# Patient Record
Sex: Female | Born: 1965 | Race: White | Hispanic: No | Marital: Single | Smoking: Current every day smoker
Health system: Southern US, Community
[De-identification: ages and names within clinical notes are randomized; demographics above are authoritative.]

## PROBLEM LIST (undated history)

## (undated) DIAGNOSIS — I10 Essential (primary) hypertension: Secondary | ICD-10-CM

## (undated) DIAGNOSIS — F319 Bipolar disorder, unspecified: Secondary | ICD-10-CM

## (undated) DIAGNOSIS — IMO0002 Reserved for concepts with insufficient information to code with codable children: Secondary | ICD-10-CM

## (undated) DIAGNOSIS — J449 Chronic obstructive pulmonary disease, unspecified: Secondary | ICD-10-CM

## (undated) DIAGNOSIS — Z22322 Carrier or suspected carrier of Methicillin resistant Staphylococcus aureus: Secondary | ICD-10-CM

## (undated) DIAGNOSIS — K219 Gastro-esophageal reflux disease without esophagitis: Secondary | ICD-10-CM

## (undated) DIAGNOSIS — C539 Malignant neoplasm of cervix uteri, unspecified: Secondary | ICD-10-CM

## (undated) DIAGNOSIS — M1990 Unspecified osteoarthritis, unspecified site: Secondary | ICD-10-CM

## (undated) DIAGNOSIS — J189 Pneumonia, unspecified organism: Secondary | ICD-10-CM

## (undated) DIAGNOSIS — R569 Unspecified convulsions: Secondary | ICD-10-CM

## (undated) DIAGNOSIS — C801 Malignant (primary) neoplasm, unspecified: Secondary | ICD-10-CM

## (undated) DIAGNOSIS — B182 Chronic viral hepatitis C: Secondary | ICD-10-CM

## (undated) DIAGNOSIS — D6851 Activated protein C resistance: Secondary | ICD-10-CM

## (undated) HISTORY — DX: Unspecified osteoarthritis, unspecified site: M19.90

## (undated) HISTORY — PX: ABDOMINAL HYSTERECTOMY: SHX81

## (undated) HISTORY — PX: COLONOSCOPY: SHX174

## (undated) HISTORY — PX: OTHER SURGICAL HISTORY: SHX169

## (undated) HISTORY — DX: Bipolar disorder, unspecified: F31.9

## (undated) HISTORY — PX: BACK SURGERY: SHX140

## (undated) HISTORY — DX: Malignant neoplasm of cervix uteri, unspecified: C53.9

## (undated) HISTORY — PX: MASS EXCISION: SHX2000

## (undated) HISTORY — DX: Unspecified convulsions: R56.9

---

## 1998-06-22 ENCOUNTER — Inpatient Hospital Stay (HOSPITAL_COMMUNITY): Admission: EM | Admit: 1998-06-22 | Discharge: 1998-06-29 | Payer: Self-pay | Admitting: *Deleted

## 1998-06-27 ENCOUNTER — Encounter: Payer: Self-pay | Admitting: Psychiatry

## 1998-07-12 ENCOUNTER — Emergency Department (HOSPITAL_COMMUNITY): Admission: EM | Admit: 1998-07-12 | Discharge: 1998-07-12 | Payer: Self-pay | Admitting: Emergency Medicine

## 1998-09-25 ENCOUNTER — Emergency Department (HOSPITAL_COMMUNITY): Admission: EM | Admit: 1998-09-25 | Discharge: 1998-09-25 | Payer: Self-pay | Admitting: Emergency Medicine

## 1998-12-03 ENCOUNTER — Inpatient Hospital Stay (HOSPITAL_COMMUNITY): Admission: AD | Admit: 1998-12-03 | Discharge: 1998-12-12 | Payer: Self-pay | Admitting: *Deleted

## 1998-12-12 ENCOUNTER — Inpatient Hospital Stay (HOSPITAL_COMMUNITY): Admission: EM | Admit: 1998-12-12 | Discharge: 1998-12-13 | Payer: Self-pay | Admitting: *Deleted

## 2001-01-29 ENCOUNTER — Emergency Department (HOSPITAL_COMMUNITY): Admission: EM | Admit: 2001-01-29 | Discharge: 2001-01-30 | Payer: Self-pay | Admitting: *Deleted

## 2001-04-19 ENCOUNTER — Emergency Department (HOSPITAL_COMMUNITY): Admission: EM | Admit: 2001-04-19 | Discharge: 2001-04-20 | Payer: Self-pay | Admitting: *Deleted

## 2001-04-19 ENCOUNTER — Encounter: Payer: Self-pay | Admitting: *Deleted

## 2001-06-07 ENCOUNTER — Other Ambulatory Visit: Admission: RE | Admit: 2001-06-07 | Discharge: 2001-06-07 | Payer: Self-pay | Admitting: Dermatology

## 2001-08-30 ENCOUNTER — Emergency Department (HOSPITAL_COMMUNITY): Admission: EM | Admit: 2001-08-30 | Discharge: 2001-08-30 | Payer: Self-pay | Admitting: *Deleted

## 2001-09-15 ENCOUNTER — Emergency Department (HOSPITAL_COMMUNITY): Admission: EM | Admit: 2001-09-15 | Discharge: 2001-09-15 | Payer: Self-pay | Admitting: Emergency Medicine

## 2001-09-15 ENCOUNTER — Encounter: Payer: Self-pay | Admitting: Emergency Medicine

## 2001-09-27 ENCOUNTER — Encounter: Payer: Self-pay | Admitting: *Deleted

## 2001-09-27 ENCOUNTER — Emergency Department (HOSPITAL_COMMUNITY): Admission: EM | Admit: 2001-09-27 | Discharge: 2001-09-27 | Payer: Self-pay | Admitting: *Deleted

## 2001-11-27 ENCOUNTER — Encounter: Payer: Self-pay | Admitting: Emergency Medicine

## 2001-11-27 ENCOUNTER — Inpatient Hospital Stay (HOSPITAL_COMMUNITY): Admission: EM | Admit: 2001-11-27 | Discharge: 2001-11-27 | Payer: Self-pay | Admitting: Emergency Medicine

## 2003-04-12 ENCOUNTER — Inpatient Hospital Stay (HOSPITAL_COMMUNITY): Admission: EM | Admit: 2003-04-12 | Discharge: 2003-04-13 | Payer: Self-pay | Admitting: Emergency Medicine

## 2003-04-13 ENCOUNTER — Inpatient Hospital Stay (HOSPITAL_COMMUNITY): Admission: AD | Admit: 2003-04-13 | Discharge: 2003-04-20 | Payer: Self-pay | Admitting: Psychiatry

## 2003-04-25 ENCOUNTER — Emergency Department (HOSPITAL_COMMUNITY): Admission: EM | Admit: 2003-04-25 | Discharge: 2003-04-25 | Payer: Self-pay | Admitting: Emergency Medicine

## 2003-05-02 ENCOUNTER — Emergency Department (HOSPITAL_COMMUNITY): Admission: EM | Admit: 2003-05-02 | Discharge: 2003-05-02 | Payer: Self-pay | Admitting: Emergency Medicine

## 2003-05-02 ENCOUNTER — Ambulatory Visit (HOSPITAL_COMMUNITY): Admission: RE | Admit: 2003-05-02 | Discharge: 2003-05-02 | Payer: Self-pay | Admitting: Unknown Physician Specialty

## 2003-09-27 ENCOUNTER — Emergency Department (HOSPITAL_COMMUNITY): Admission: EM | Admit: 2003-09-27 | Discharge: 2003-09-28 | Payer: Self-pay | Admitting: *Deleted

## 2003-11-15 ENCOUNTER — Ambulatory Visit: Payer: Self-pay | Admitting: Orthopedic Surgery

## 2003-11-28 ENCOUNTER — Ambulatory Visit (HOSPITAL_COMMUNITY): Admission: RE | Admit: 2003-11-28 | Discharge: 2003-11-28 | Payer: Self-pay | Admitting: *Deleted

## 2004-01-02 ENCOUNTER — Emergency Department (HOSPITAL_COMMUNITY): Admission: EM | Admit: 2004-01-02 | Discharge: 2004-01-02 | Payer: Self-pay | Admitting: Emergency Medicine

## 2004-11-21 ENCOUNTER — Emergency Department (HOSPITAL_COMMUNITY): Admission: EM | Admit: 2004-11-21 | Discharge: 2004-11-21 | Payer: Self-pay | Admitting: Emergency Medicine

## 2004-11-24 ENCOUNTER — Emergency Department (HOSPITAL_COMMUNITY): Admission: EM | Admit: 2004-11-24 | Discharge: 2004-11-24 | Payer: Self-pay | Admitting: Emergency Medicine

## 2005-11-12 ENCOUNTER — Ambulatory Visit (HOSPITAL_BASED_OUTPATIENT_CLINIC_OR_DEPARTMENT_OTHER): Admission: RE | Admit: 2005-11-12 | Discharge: 2005-11-12 | Payer: Self-pay | Admitting: Orthopedic Surgery

## 2005-12-16 ENCOUNTER — Emergency Department (HOSPITAL_COMMUNITY): Admission: EM | Admit: 2005-12-16 | Discharge: 2005-12-16 | Payer: Self-pay | Admitting: Emergency Medicine

## 2005-12-18 ENCOUNTER — Ambulatory Visit: Payer: Self-pay | Admitting: Orthopedic Surgery

## 2006-09-04 ENCOUNTER — Emergency Department (HOSPITAL_COMMUNITY): Admission: EM | Admit: 2006-09-04 | Discharge: 2006-09-04 | Payer: Self-pay | Admitting: Emergency Medicine

## 2006-09-10 ENCOUNTER — Ambulatory Visit: Payer: Self-pay | Admitting: Orthopedic Surgery

## 2006-09-19 ENCOUNTER — Emergency Department (HOSPITAL_COMMUNITY): Admission: EM | Admit: 2006-09-19 | Discharge: 2006-09-19 | Payer: Self-pay | Admitting: Emergency Medicine

## 2006-10-14 ENCOUNTER — Emergency Department (HOSPITAL_COMMUNITY): Admission: EM | Admit: 2006-10-14 | Discharge: 2006-10-14 | Payer: Self-pay | Admitting: Emergency Medicine

## 2006-12-04 ENCOUNTER — Emergency Department (HOSPITAL_COMMUNITY): Admission: EM | Admit: 2006-12-04 | Discharge: 2006-12-04 | Payer: Self-pay | Admitting: Emergency Medicine

## 2007-01-24 ENCOUNTER — Emergency Department (HOSPITAL_COMMUNITY): Admission: EM | Admit: 2007-01-24 | Discharge: 2007-01-24 | Payer: Self-pay | Admitting: Emergency Medicine

## 2007-02-10 ENCOUNTER — Ambulatory Visit (HOSPITAL_COMMUNITY): Admission: RE | Admit: 2007-02-10 | Discharge: 2007-02-10 | Payer: Self-pay | Admitting: *Deleted

## 2007-03-08 ENCOUNTER — Emergency Department (HOSPITAL_COMMUNITY): Admission: EM | Admit: 2007-03-08 | Discharge: 2007-03-08 | Payer: Self-pay | Admitting: Emergency Medicine

## 2007-04-06 ENCOUNTER — Emergency Department (HOSPITAL_COMMUNITY): Admission: EM | Admit: 2007-04-06 | Discharge: 2007-04-06 | Payer: Self-pay | Admitting: Emergency Medicine

## 2007-08-15 ENCOUNTER — Emergency Department (HOSPITAL_COMMUNITY): Admission: EM | Admit: 2007-08-15 | Discharge: 2007-08-15 | Payer: Self-pay | Admitting: Emergency Medicine

## 2007-11-02 ENCOUNTER — Encounter (HOSPITAL_COMMUNITY): Admission: RE | Admit: 2007-11-02 | Discharge: 2007-12-02 | Payer: Self-pay | Admitting: *Deleted

## 2007-11-13 ENCOUNTER — Emergency Department (HOSPITAL_COMMUNITY): Admission: EM | Admit: 2007-11-13 | Discharge: 2007-11-13 | Payer: Self-pay | Admitting: Emergency Medicine

## 2007-11-16 ENCOUNTER — Ambulatory Visit (HOSPITAL_COMMUNITY): Admission: RE | Admit: 2007-11-16 | Discharge: 2007-11-16 | Payer: Self-pay | Admitting: Neurological Surgery

## 2007-11-29 ENCOUNTER — Ambulatory Visit (HOSPITAL_COMMUNITY): Admission: RE | Admit: 2007-11-29 | Discharge: 2007-11-30 | Payer: Self-pay | Admitting: Neurological Surgery

## 2007-12-17 ENCOUNTER — Encounter: Payer: Self-pay | Admitting: Neurosurgery

## 2007-12-18 ENCOUNTER — Inpatient Hospital Stay (HOSPITAL_COMMUNITY): Admission: EM | Admit: 2007-12-18 | Discharge: 2007-12-30 | Payer: Self-pay | Admitting: Neurological Surgery

## 2007-12-18 ENCOUNTER — Ambulatory Visit: Payer: Self-pay | Admitting: Pulmonary Disease

## 2007-12-18 ENCOUNTER — Ambulatory Visit: Payer: Self-pay | Admitting: Internal Medicine

## 2007-12-20 ENCOUNTER — Encounter: Payer: Self-pay | Admitting: Emergency Medicine

## 2007-12-23 ENCOUNTER — Ambulatory Visit: Payer: Self-pay | Admitting: Infectious Diseases

## 2007-12-25 ENCOUNTER — Encounter (INDEPENDENT_AMBULATORY_CARE_PROVIDER_SITE_OTHER): Payer: Self-pay | Admitting: Interventional Radiology

## 2007-12-27 ENCOUNTER — Encounter (INDEPENDENT_AMBULATORY_CARE_PROVIDER_SITE_OTHER): Payer: Self-pay | Admitting: Cardiology

## 2008-01-18 ENCOUNTER — Encounter: Admission: RE | Admit: 2008-01-18 | Discharge: 2008-01-18 | Payer: Self-pay | Admitting: Neurological Surgery

## 2008-02-28 ENCOUNTER — Ambulatory Visit (HOSPITAL_COMMUNITY): Admission: RE | Admit: 2008-02-28 | Discharge: 2008-02-28 | Payer: Self-pay | Admitting: *Deleted

## 2008-05-12 ENCOUNTER — Ambulatory Visit (HOSPITAL_COMMUNITY): Admission: RE | Admit: 2008-05-12 | Discharge: 2008-05-12 | Payer: Self-pay | Admitting: *Deleted

## 2008-06-26 ENCOUNTER — Ambulatory Visit (HOSPITAL_COMMUNITY): Admission: RE | Admit: 2008-06-26 | Discharge: 2008-06-26 | Payer: Self-pay | Admitting: *Deleted

## 2009-01-31 IMAGING — CR DG CHEST 1V PORT
1 series · 1 of 1 positions shown · non-contrast
Comparison: Chest x-ray of 12/19/2007

CLINICAL DATA: Pneumonia, wound infection

PORTABLE CHEST - 1 VIEW

[AP]
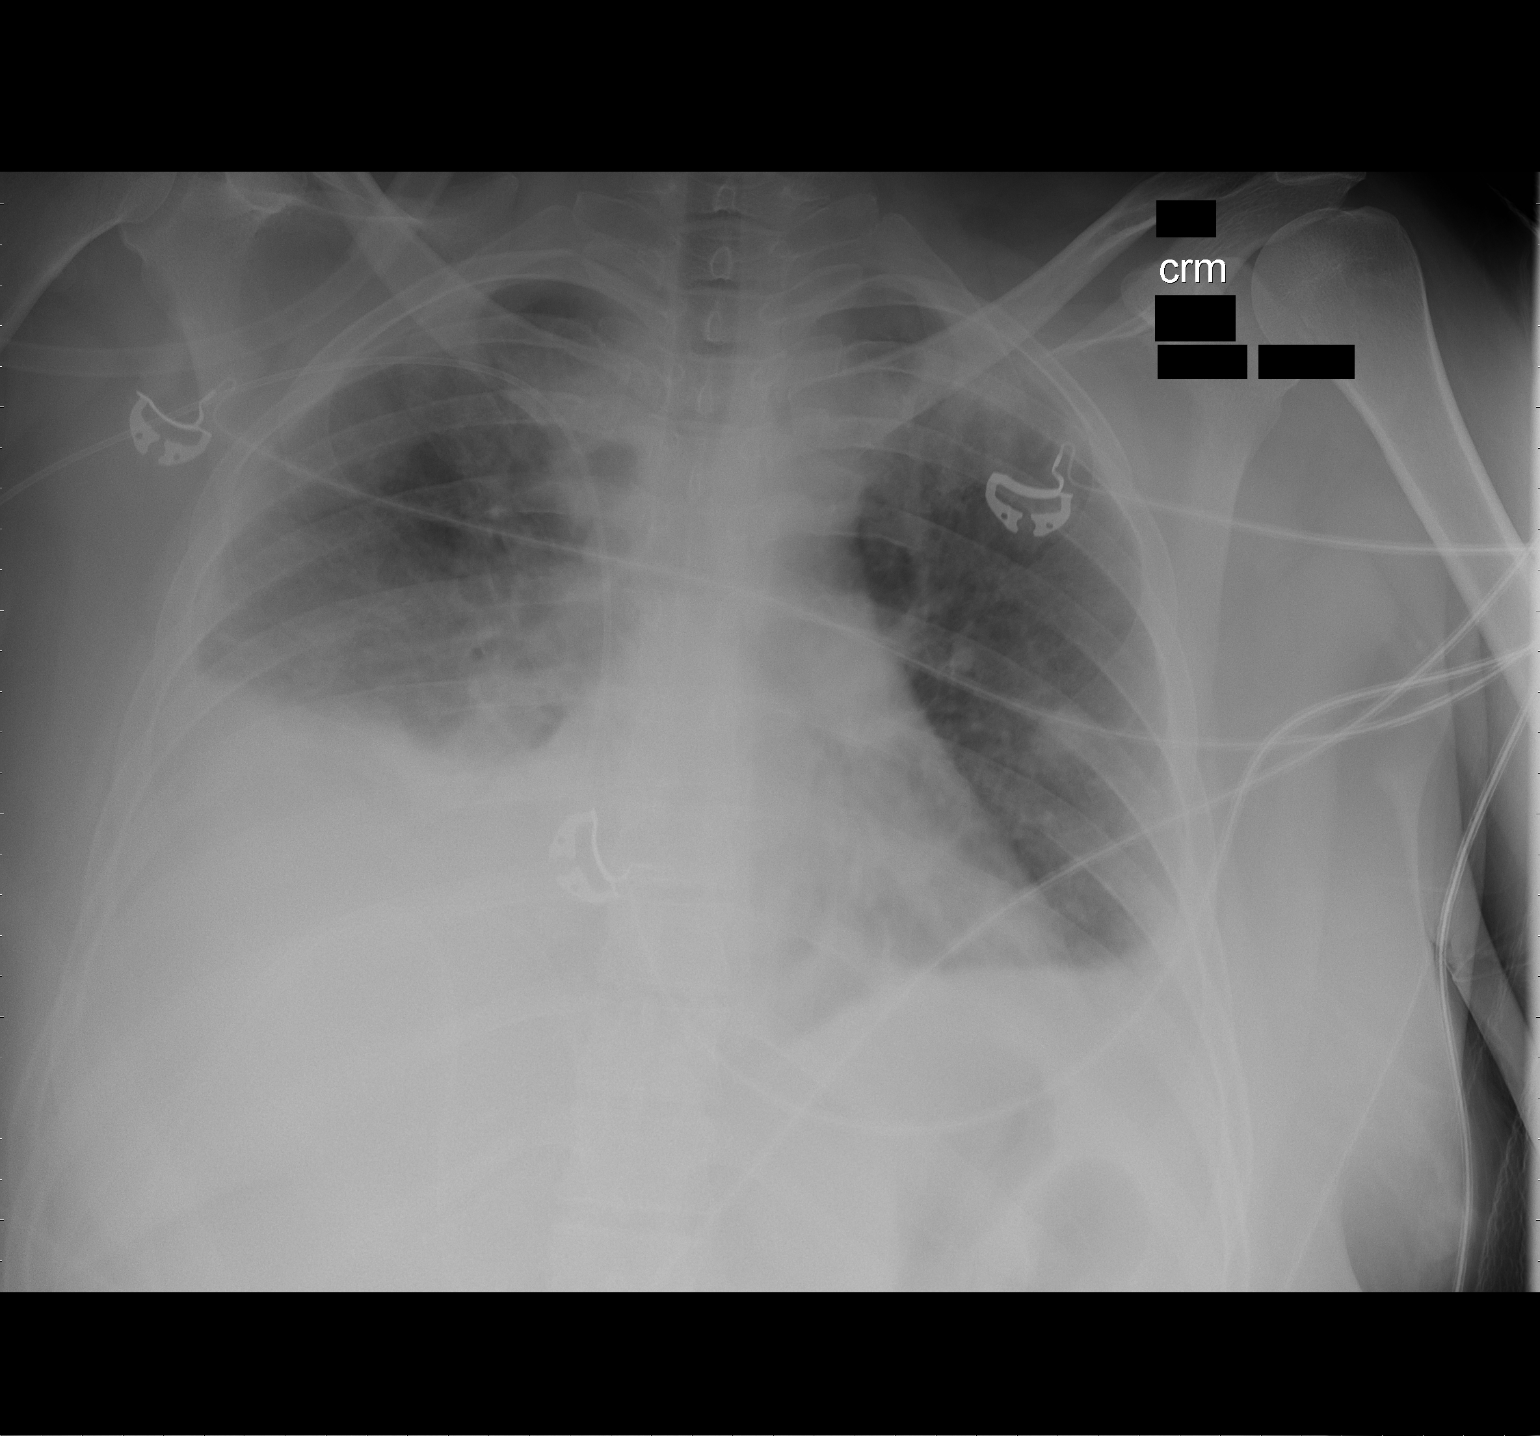

[1 of 1 positions shown; findings below may reference images not displayed]

FINDINGS: The lungs are poorly aerated with basilar atelectasis and
effusions right greater than left.  The cavitary pulmonary nodules
noted on CT are not well visualized on chest x-ray.  Right PICC
line remains with the tip in the lower SVC just above the expected
right atrial junction.  Heart size is stable.
IMPRESSION: No significant change in poor aeration with effusions.  A nodular
lesion is noted in the left mid lung base with the remainder of the
cavitary lesions noted on CT not being well seen by chest x-ray.

## 2009-02-01 IMAGING — CR DG CHEST 1V PORT
1 series · 1 of 1 positions shown · non-contrast
Comparison: 12/20/2007

CLINICAL DATA: Wound infection.  Evaluate respiratory status. Prior
chest CT demonstrated cavitary lesions.

PORTABLE CHEST - 1 VIEW

[AP]
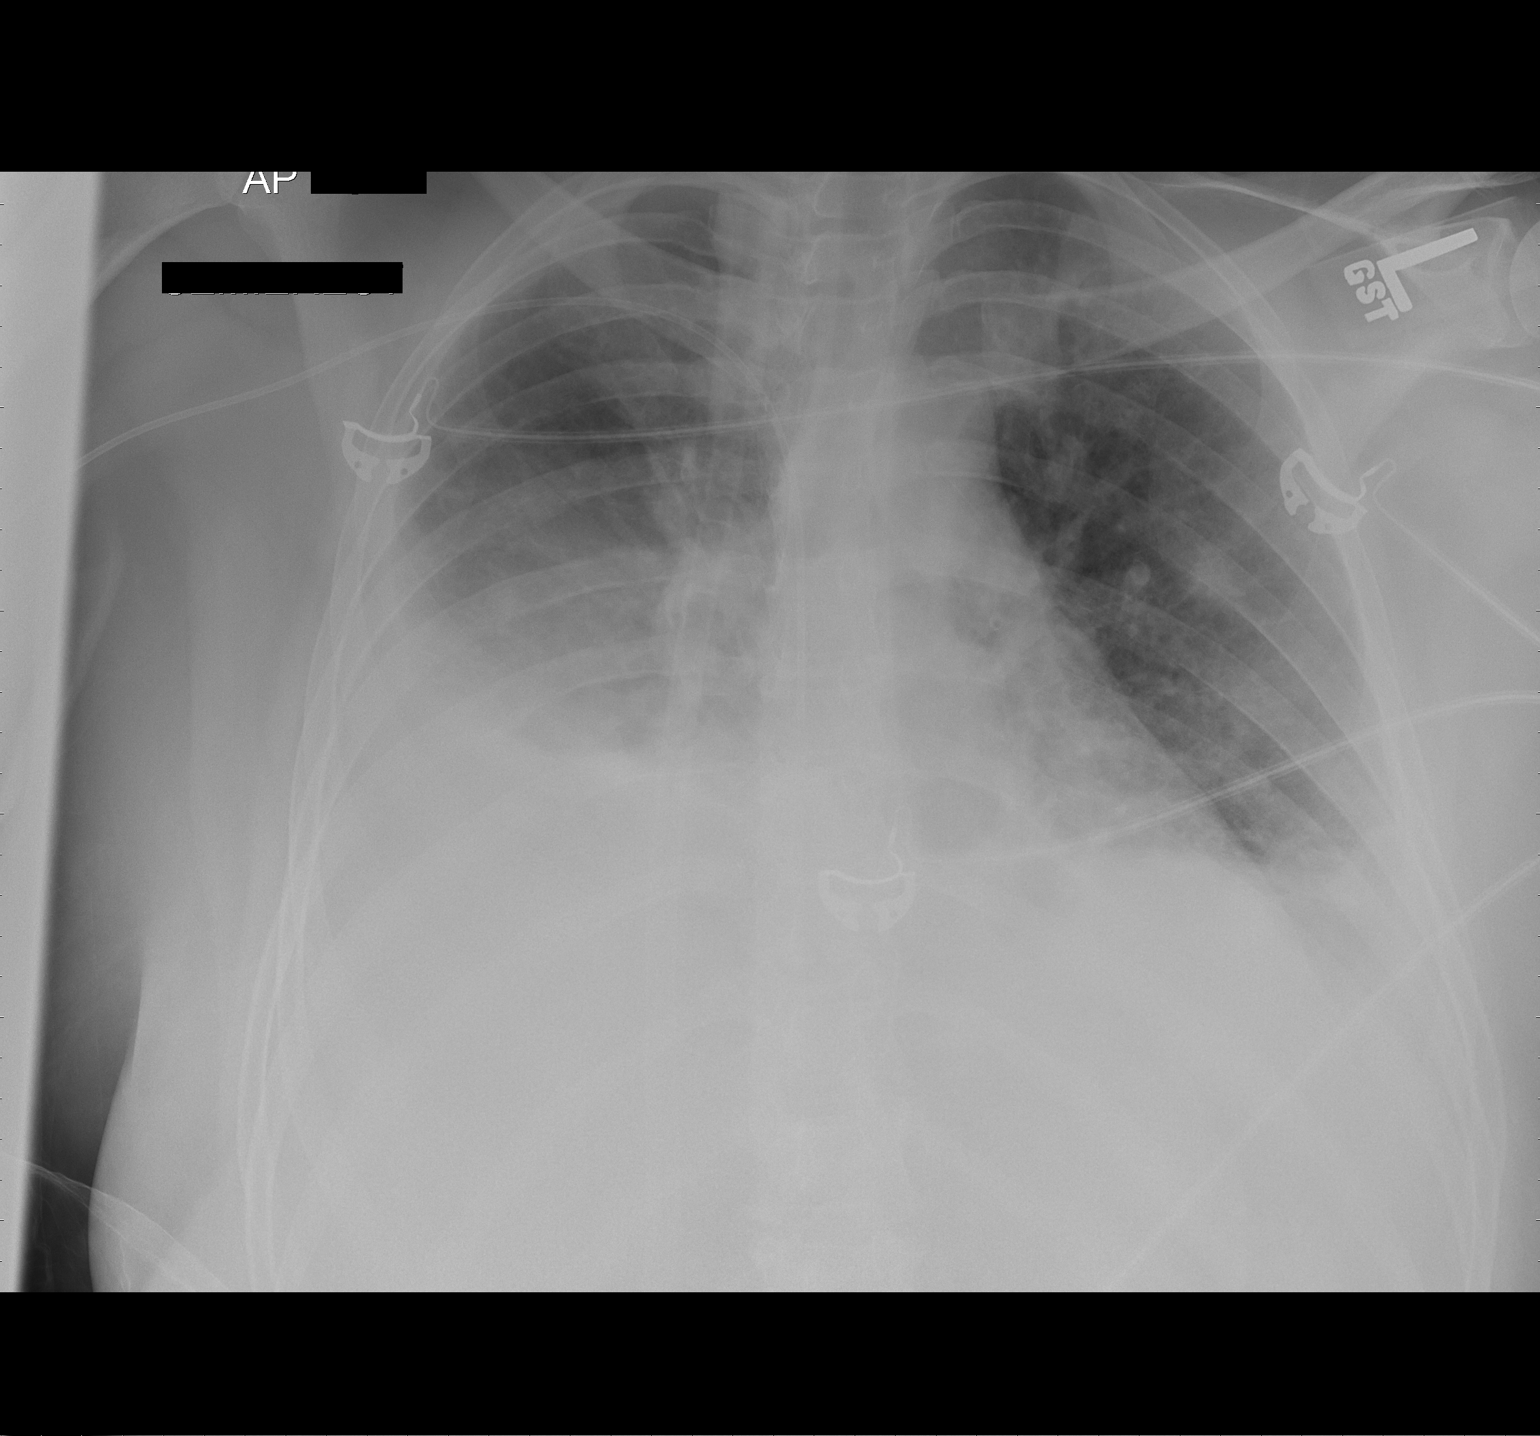

[1 of 1 positions shown; findings below may reference images not displayed]

FINDINGS: Right upper extremity PICC is present with the tip in the
lower SVC, unchanged from prior examination.  Moderate bilateral
pleural effusions, right greater than left.  Positional shift in
the right pleural effusion with more posterior/dependent layering.

Nodular lung lesions again visualized in the left and right
midlung.  Cardiopericardial silhouette grossly unchanged.

Allowing for positional differences, no interval change from
yesterday.
IMPRESSION: 1.  Stable right upper extremity PICC.
2.  Slight positional shift and bilateral pleural effusions.
Otherwise no interval change.
3.  Limited visualization of bilateral lung nodular lesions.

## 2009-02-04 ENCOUNTER — Emergency Department (HOSPITAL_COMMUNITY): Admission: EM | Admit: 2009-02-04 | Discharge: 2009-02-04 | Payer: Self-pay | Admitting: Emergency Medicine

## 2009-02-04 IMAGING — US US CHEST/MEDIASTINUM
1 series · 5 of 5 positions shown · non-contrast
Comparison: Prior thoracentesis imaging on 12/22/2007 and CT of the
chest on 12/19/2007.

CLINICAL DATA: Pneumonia, bacteremia and right pleural effusion.
Chest x-ray has demonstrated probable increasing right pleural
effusion and request has been made to perform thoracentesis versus
percutaneous drainage catheter placement.

CHEST ULTRASOUND

[Series 1: unknown · 0.33mm/px · 5 of 5 slices shown]
[im 1/5]
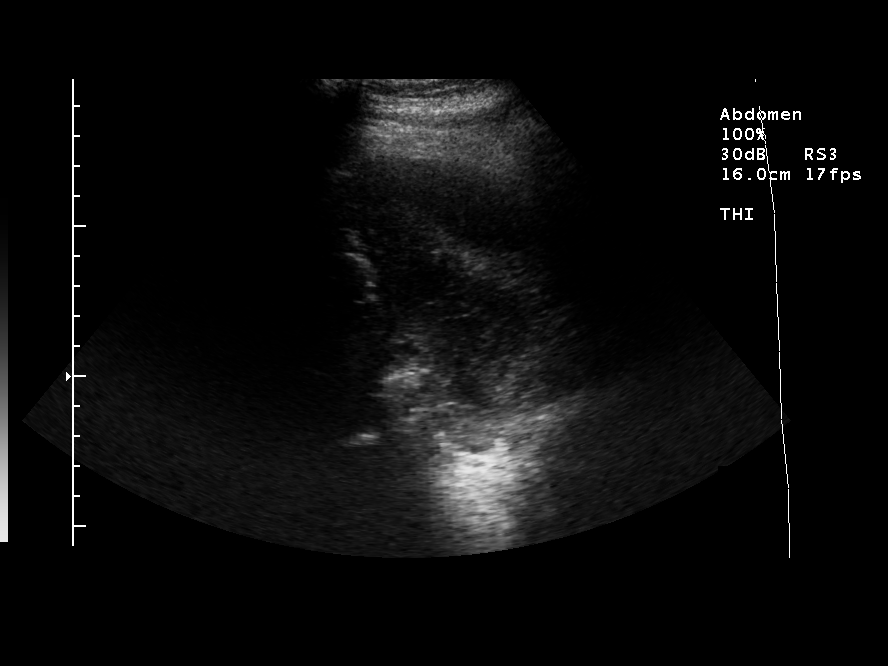
[im 2/5]
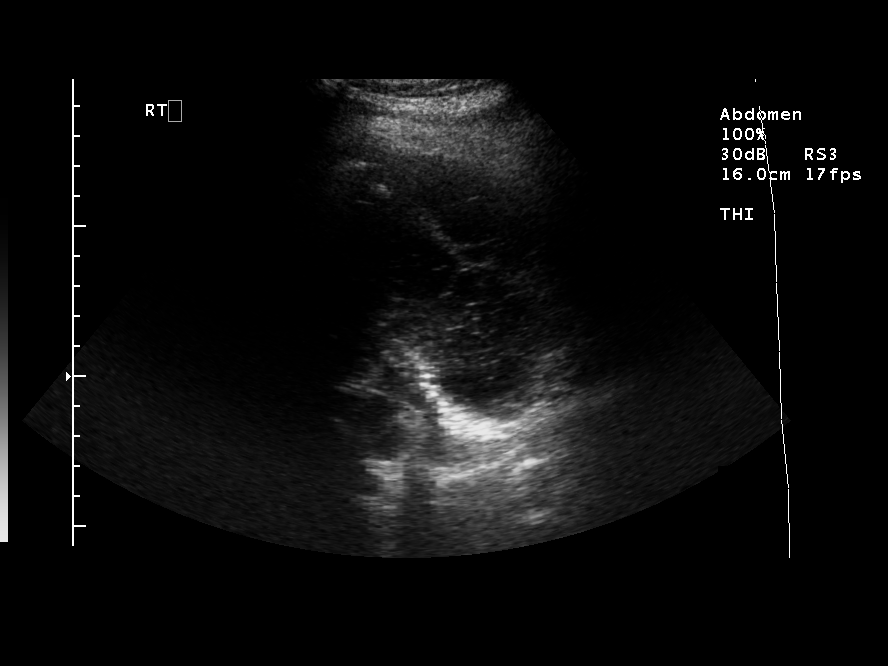
[im 3/5]
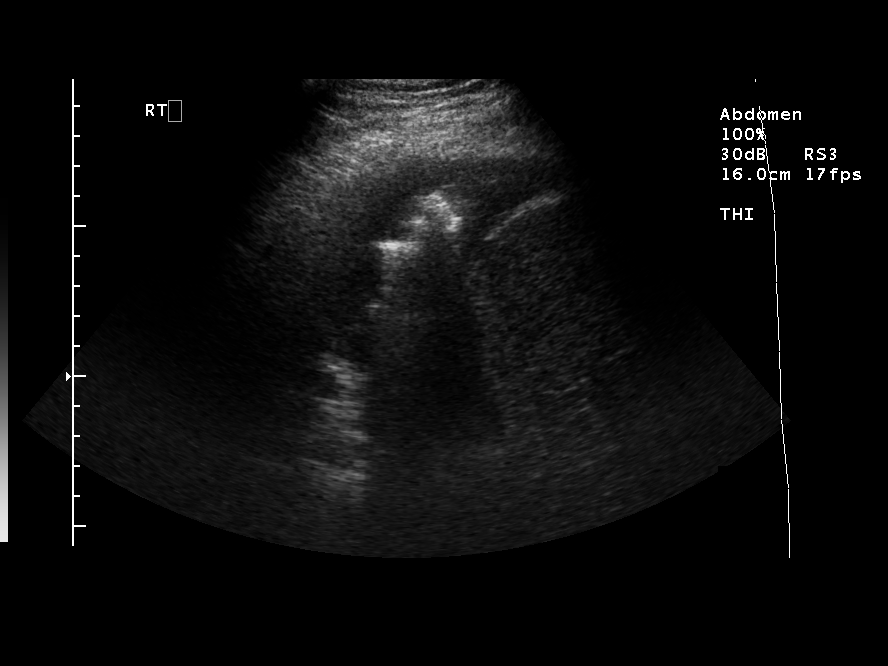
[im 4/5]
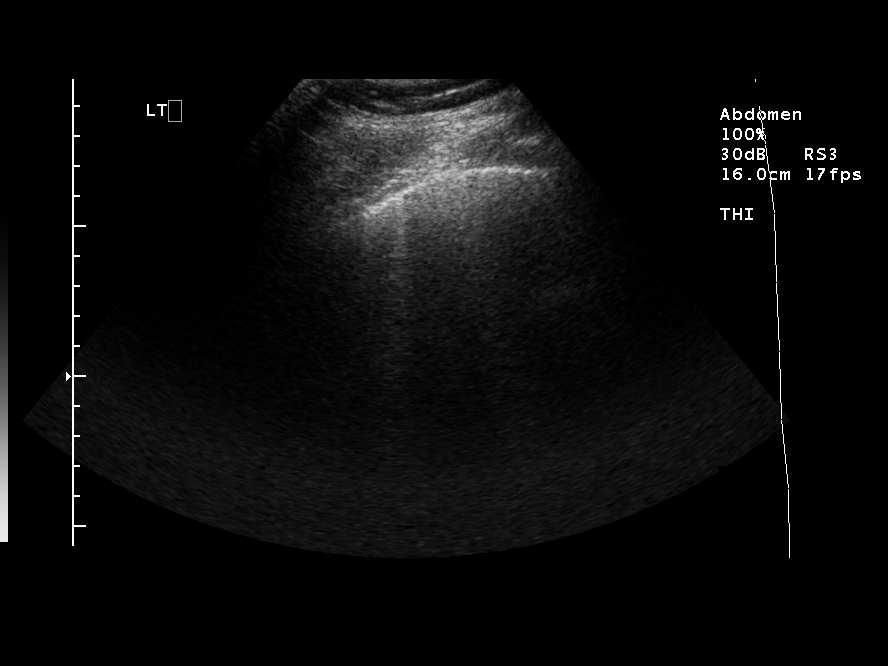
[im 5/5]
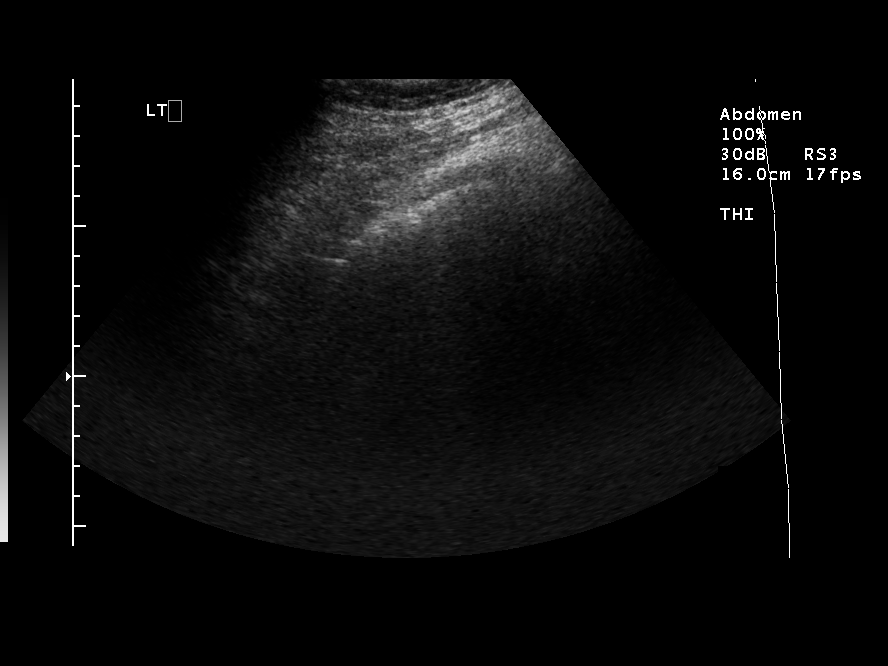

[5 of 5 positions shown; findings below may reference images not displayed]

FINDINGS: Prior to a possible ultrasound guided procedure,
ultrasound was performed of the right posterior chest with the the
patient in a sitting upright position.  This demonstrates a
component of a loculated and echogenic pleural collection as well
as densely consolidated underlying lung.  It was not totally clear
how much of the echogenic abnormality was in the pleural space and
how much was actually in the lung parenchyma.  I was not
comfortable today with an ultrasound guided procedure.

CT of the chest is recommended to reevaluate the process in the
right lung.  This will be performed on 12/25/2007.  At that time,
if appropriate, either CT guided thoracentesis or CT guided tube
thoracostomy will be performed.  This plan was discussed directly
with the patient.
IMPRESSION: Ultrasound demonstrates component of a loculated and very echogenic
pleural effusion as well as densely consolidated underlying lung.
It was unclear how much of the abnormality was actually in the
pleural space and therefore an ultrasound guided procedure was not
performed.  Plans have been made to perform a CT of the chest in
the morning with possible CT guided thoracentesis or tube
thoracostomy at that time.

## 2009-02-14 ENCOUNTER — Emergency Department (HOSPITAL_COMMUNITY): Admission: EM | Admit: 2009-02-14 | Discharge: 2009-02-14 | Payer: Self-pay | Admitting: Emergency Medicine

## 2009-09-01 ENCOUNTER — Emergency Department (HOSPITAL_COMMUNITY): Admission: EM | Admit: 2009-09-01 | Discharge: 2009-09-02 | Payer: Self-pay | Admitting: Emergency Medicine

## 2010-01-27 ENCOUNTER — Encounter: Payer: Self-pay | Admitting: *Deleted

## 2010-03-24 ENCOUNTER — Emergency Department (HOSPITAL_COMMUNITY)
Admission: EM | Admit: 2010-03-24 | Discharge: 2010-03-24 | Disposition: A | Discharge status: Home / Self Care | Payer: Medicaid Other | Attending: Emergency Medicine | Admitting: Emergency Medicine

## 2010-03-24 ENCOUNTER — Emergency Department (HOSPITAL_COMMUNITY): Payer: Medicaid Other

## 2010-03-24 DIAGNOSIS — M549 Dorsalgia, unspecified: Secondary | ICD-10-CM | POA: Insufficient documentation

## 2010-03-24 DIAGNOSIS — G8929 Other chronic pain: Secondary | ICD-10-CM | POA: Insufficient documentation

## 2010-03-24 DIAGNOSIS — J209 Acute bronchitis, unspecified: Secondary | ICD-10-CM | POA: Insufficient documentation

## 2010-03-24 DIAGNOSIS — Z79899 Other long term (current) drug therapy: Secondary | ICD-10-CM | POA: Insufficient documentation

## 2010-04-22 ENCOUNTER — Emergency Department (HOSPITAL_COMMUNITY)
Admission: EM | Admit: 2010-04-22 | Discharge: 2010-04-22 | Disposition: A | Discharge status: Home / Self Care | Payer: Medicaid Other | Attending: Emergency Medicine | Admitting: Emergency Medicine

## 2010-04-22 ENCOUNTER — Emergency Department (HOSPITAL_COMMUNITY): Payer: Medicaid Other

## 2010-04-22 DIAGNOSIS — M79609 Pain in unspecified limb: Secondary | ICD-10-CM | POA: Insufficient documentation

## 2010-04-22 DIAGNOSIS — Z79899 Other long term (current) drug therapy: Secondary | ICD-10-CM | POA: Insufficient documentation

## 2010-04-22 DIAGNOSIS — F17200 Nicotine dependence, unspecified, uncomplicated: Secondary | ICD-10-CM | POA: Insufficient documentation

## 2010-04-22 DIAGNOSIS — J449 Chronic obstructive pulmonary disease, unspecified: Secondary | ICD-10-CM | POA: Insufficient documentation

## 2010-04-22 DIAGNOSIS — M25559 Pain in unspecified hip: Secondary | ICD-10-CM | POA: Insufficient documentation

## 2010-04-22 DIAGNOSIS — J4489 Other specified chronic obstructive pulmonary disease: Secondary | ICD-10-CM | POA: Insufficient documentation

## 2010-04-22 DIAGNOSIS — W64XXXA Exposure to other animate mechanical forces, initial encounter: Secondary | ICD-10-CM | POA: Insufficient documentation

## 2010-04-22 DIAGNOSIS — T07XXXA Unspecified multiple injuries, initial encounter: Secondary | ICD-10-CM | POA: Insufficient documentation

## 2010-04-22 DIAGNOSIS — B1920 Unspecified viral hepatitis C without hepatic coma: Secondary | ICD-10-CM | POA: Insufficient documentation

## 2010-05-21 NOTE — Discharge Summary (Signed)
[name], [name]             ACCOUNT NO.:  [id number]   MEDICAL RECORD NO.:  [id number]          PATIENT TYPE:  INP   LOCATION:  3027                         FACILITY:  [facility]   PHYSICIAN:  Charlestine Massed, MDDATE OF BIRTH:  10/28/1965   DATE OF ADMISSION:  12/18/2007  DATE OF DISCHARGE:                               DISCHARGE SUMMARY   DATE OF DISCHARGE:  Not known.   REASON FOR ADMISSION:  Lumbar wound infection.   HOSPITAL COURSE:  [name], who is a 45 year old female, who is status  3 weeks post lumbar microdiskectomy, presented with fever, back pain,  and drainage from the wound, and the patient was seen by Neurosurgery,  Dr. Marikay Alar.  Dr. Marikay Alar saw the patient and they evaluated for  a lumbar wound postoperative infection.  The patient had a surgery done,  and the wound was debrided and the internal tissues were sent for  culture, which grew MRSA, and so patient was started on IV vancomycin by  Neurosurgery, and patient was transferred to our service.  Blood  cultures did not grow any organisms so far.  Patient was observed on the  floor with IV antibiotics.  An ID consult was called and they evaluated  the patient and a TEE was advised by ID to rule out any coexistent  endocarditis, and also ID wanted 6 weeks of total IV vancomycin therapy  for further treatment of the lumbar wound infection.  In the meantime,  patient had a rise in the WBC count and a right-sided loculated pleural  effusion was seen on CAT scan so a CT-guided drainage was done and a  chest tube was placed.  The pleural fluid examination revealed there is  an exudate, but the gram stain revealed there is no evidence of any  infection so a diagnosis of parapneumonic effusion was made.  The  patient is still continuing on the chest tube and there is mild amber-  colored fluid draining out of the area.  The patient is currently  afebrile.  The WBC count is coming down.  The patient is  planned for  PICC line placement tomorrow in the a.m. for a total of 6 weeks of IV  vancomycin therapy.   CURRENT ALLERGIES:  The patient is ALLERGIC TO PENICILLIN.   CURRENT MEDICATIONS:  Vancomycin as per aggressive protocol, NovoLog  coverage for blood sugars, Protonix,  Albuterol MDI, oxycodone for pain,  Gabapentin, and Cymbalta.   DISPOSITION:  The patient will be discharged once her PICC line is  placed and the chest tube is removed.  The patient will be observed for  the next 24 hours after chest tube removal and will be discharged home  with IV vancomycin.  Social Services is already involved in placing a  visiting nurse for her IV vancomycin at home.  The patient to follow up  with Dr. Marikay Alar, Neurosurgery, for further management as well as  with her primary care doctor for further primary care services.      Charlestine Massed, MD  Electronically Signed     UT/MEDQ  D:  12/27/2007  T:  12/27/2007  Job:  [id number]

## 2010-05-21 NOTE — Discharge Summary (Signed)
NAMETINSLEE, KLARE             ACCOUNT NO.:  [id number]   MEDICAL RECORD NO.:  [id number]          PATIENT TYPE:  INP   LOCATION:  3027                         FACILITY:  [facility]   PHYSICIAN:  Monte Fantasia, MD  DATE OF BIRTH:  October 29, 1965   DATE OF ADMISSION:  12/18/2007  DATE OF DISCHARGE:  12/30/2007                               DISCHARGE SUMMARY   PRIMARY CARE PHYSICIAN:  Unassigned.   DISCHARGE DIAGNOSES:  1. Status post lumbar wound infection with methicillin-resistant      Staphylococcus aureus infection.  2. Methicillin-resistant Staphylococcus aureus cellulitis.  3. Acute hypoxic respiratory failure secondary to bilateral pneumonia,      which is resolving.  4. Hyponatremia.  5. Adult respiratory distress syndrome, resolved.  6. Lumbar wound infection status post lumbar microdiskectomy.  7. Right-sided loculated parapneumonic effusion.  8. Status post chest tube drainage and removal of the chest tubes.  9. History of hepatitis C.  10.History of lumbar laminectomy.   MEDICATIONS ON DISCHARGE:  1. Colace 100 mg p.o. b.i.d.  2. Cymbalta 120 mg p.o. daily.  3. Advair 250/50 one puff b.i.d.  4. Neurontin 600 mg p.o. q.6 h.  5. Protonix 40 mg p.o. q.12.  6. Vancomycin 1 gm IV q.8 h., day 13 today to complete 42 days that is      6 weeks of IV antibiotics.  7. Albuterol inhalation 2 puffs q.6 h. p.r.n. breathlessness.   HOSPITAL COURSE:  Please refer to the discharge summary dictated on  December 27, 2007, for the hospital course by Dr. Berkley Harvey.  The patient  had a TE, after that which showed no evidence of any bacterial  endocarditis or any other vegetations.  The patient improved very well.  The patient was evaluated by Pulmonary and CT drain chest tube was  removed on 12/29/2007 in the later afternoon.  The patient has been  asymptomatic.  Has had no temperature spikes or respiratory distress  after which the patient is stable to be discharged and can be  discharged  home.  As per the pulmonary recommendation, they recommend to have  follow up CT of the chest after completion of the antibiotics.  The  patient is stable to be discharged home today.   Radiological investigations done during the stay in the hospital   MRI of the lumbar spine done on the December 17, 2007.  Impression,  typical findings along with operative approach of left L3-L4 and L4-L5,  small amount of fluid in the subcutaneous fat, mild edema along  operative approach, very small amount of fluid at the laminectomy site.  No signs of epidural collection though findings are not particularly  worrisome it could not exclude a low-grade infection on the basis of  this appearance.   Chest x-ray done on December 18, 2007.  Impression:  Plate-like lower lobe atelectasis.  If the patient's  symptoms continue, consider PA and lateral radiographs to obtain full  inspiration when the patient is clinically able to.   Chest x-ray done on December 19, 2007.  Impression:  Bilateral airspace disease with nodularity suggest  pneumonia, increased right lower  lobe atelectasis and effusion.   CT of the angio done on December 19, 2007.  Impression:  Bilateral cavitary lung nodules with no consolidative  changes in the right, middle, or lower lobe.  Differential diagnosis  would be fungal pneumonia, Wegener granulomatosis, or septic pulmonary  emboli.  No pulmonary embolism identified on this study.   Chest x-ray done on December 20, 2007.  Impression:  No significant change and poor aeration with effusions,  nodular lesion is noted in the left and middle lung base with remainder  of the cavitary lesions noted on the CT, not well seen by the chest x-  ray.   Chest x-ray done on December 21, 2007.  Impression:  Stable right upper extremity PICC line, slight positional  shift, and bilateral pleural effusions, limited visualization of  bilateral lung nodular lesions.   Chest x-ray  done on December 22, 2007:  Impression:  No pneumothorax, post thoracentesis with significant  residual right pleural effusion.   Chest x-ray done on December 24, 2007.  Impression:  Slightly better aeration, moderate right-sided pleural  effusion with right basilar atelectasis, bilateral lung nodules with  some appearing cavitating.   CT of the chest with contrast done on December 25, 2007.  Impression:  Generally improving parenchymal infection since prior CT  with the decrease in size of the bilateral cavitary lesions and less  prominent confluent airspace disease.  There is, however, increase in  the component of loculated pleural effusion on the right.   CT abscess drainage done on December 25, 2007:  Impression:  Placement of 14-French drainage catheter into the right  pleural space to drain the loculated pleural effusion.  Catheter was  placed in lateral component of loculated pleural effusion.   Chest x-ray done on December 29, 2007.  Impression:  No significant interval change post chest tube removal.  No  evidence of pneumothorax.   PROCEDURES DONE DURING THE STAY IN THE HOSPITAL:  1. Incision and drainage of the lumbar wound done on December 18, 2007.  2. Chest tube, which was placed on December 25, 2007, and removal of      the chest tube done on December 29, 2007.   LABS ON DISCHARGE:  Total WBC 9.2, hemoglobin 9.4, hematocrit 28.2,  platelets 478.  Sodium 138, potassium 4.1, chloride 110, bicarb 24,  glucose 95, BUN 8, creatinine 0.7, calcium 8.2.  Blood cultures done on  December 19, 2007, no growth for 5 days.  Gram stain done on December 18, 2007, rare Gram-positive cocci in clusters and pairs.  Culture of  the sputum done on December 19, 2007, no growth.  Gram stain done on  December 20, 2007, no WBC, negative for bacteria.  Wound culture done on  December 18, 2007, which is moderate methicillin-resistant  Staphylococcus aureus, which was sensitive to  vancomycin, Bactrim,  rifampin, gentamicin, clindamycin, and tetracyclines.  AFB cultures done  on December 13, no acid-fast bacilli seen.  Respiratory cultures done on  December 19, 2007, no growth for 2 days.  Urine culture done on December 20, 2007, no growth.  Anaerobic cultures done on December 18, 2007, no  anaerobe is isolated.  Gram-stain done on December 25, 2007, no organism  seen.  Anaerobic cultures done on December 25, 2007, no anaerobes  isolated.  Fungal cultures done on December 19, 2007, yeast in sputum,  and culture of the body fluid done on December 25, 2007, no organism  seen.  DISPOSITION:  Plan to discharge the patient today.  Social services have  arranged for the visiting nurse service for IV vancomycin at home.  The  patient is advised to follow up with her primary care physician in next  week.  The patient is being discharged on PICC line for IV antibiotics  for a total of 6 weeks and has completed day 13, will be day 13 of  antibiotics today.      Monte Fantasia, MD  Electronically Signed     MP/MEDQ  D:  12/30/2007  T:  12/31/2007  Job:  [id number]

## 2010-05-21 NOTE — Consult Note (Signed)
NAMEKELLER, [name]             ACCOUNT NO.:  [id number]   MEDICAL RECORD NO.:  [id number]          PATIENT TYPE:  INP   LOCATION:  2116                         FACILITY:  [facility]   PHYSICIAN:  Maryla Morrow, MD        DATE OF BIRTH:  1965-02-24   DATE OF CONSULTATION:  12/19/2007  DATE OF DISCHARGE:                                 CONSULTATION   REQUESTING PHYSICIAN:  Tia Alert, MD   REASON FOR CONSULTATION:  Hyponatremia.   HISTORY OF PRESENT ILLNESS:  [name] is a very pleasant 45 year old  white female with history of lumbar microdiscectomy 3 weeks ago followed  by recent lumbar wound infection as evident by drainage and persistent  fever who was admitted under the service of Dr. Yetta Barre for the management  of the above.  She was also noted to have hyponatremia at this time.  Interestingly, over the course of last 12 hours, the patient has been  noticing worsening shortness of breath with cough, which she states has  been going on for the last 3 weeks.  She had a CT angiogram done today  which revealed bilateral pulmonary consolidation, right more than left.  She has been running, tachycardic over the course of last 12 hours with  persistent fever.  Currently, she is on nasal face mask and awake and  arousable, but more somnolent.   PAST MEDICAL HISTORY:  Significant for,  1. Hepatitis C.  2. MRSA.  3. Recent lumbar laminectomy.   PAST SURGICAL HISTORY:  Recent lumbar microdiscectomy 3 weeks ago.   HOME MEDICATIONS:  Neurontin, Cymbalta, hydrochlorothiazide, Xanax, and  doxycycline.   ALLERGIES:  The patient is allergic to PENICILLIN.   SOCIAL HISTORY:  Positive for tobacco, alcohol, and drug abuse.  The  patient lives with her daughter.   REVIEW OF SYSTEMS:  Currently unobtainable as the patient is somnolent.   PHYSICAL EXAMINATION:  GENERAL:  A 45 year old white female currently  somnolent; however, arousable and follows commands, but not oriented.  VITAL  SIGNS:  Temperature was 98.9 with a pulse rate 117, respirations  20, blood pressure 118/78 with 94% on face mask.  HEENT:  Pupils are equal, round, and reactive to light.  Extraocular  movements intact.  Throat:  The  oropharynx is clear.  Head is  atraumatic and normocephalic.  Mucous membranes are moist.  NECK:  No JVD.  No lymphadenopathy.  CHEST:  Auscultation reveals bilateral rales, right more than left.  HEART:  Regular rate and rhythm, tachycardic.  ABDOMEN:  Soft and nontender.  EXTREMITIES:  No edema, cyanosis, or clubbing.  Peripheral pulses are 2+  bilaterally.  Strength is 5/5 throughout.  Sensation is intact.  Speech  is normal.  Positive JP drain cleaning from the back.  PSYCHIATRIC:  Mood and affect normal.   LABORATORY DATA:  ABG done today at 2 p.m. revealed a pH of 7.47 with a  PCO2 of 43.5, and PO2 of 56.4.  White count 6.6, hemoglobin 13.7,  platelets 146, and neutrophils 78.  Her sodium level today was 132 which  has increased from 127.  Potassium was still low at 2.2, chloride 87,  BUN 4, and creatinine 0.69.  CT angiogram done today, rule out pulmonary  embolism; however, there was evidence of bilateral evident lower lobe  consolidation, right side more than the left.  Final report is pending.  I reviewed that with radiologist on call.   ASSESSMENT AND PLAN:  1. Acute hypoxic respiratory failure probably secondary to bilateral      pneumonia either of viral or bacterial origin.  2. Hyponatremia probably secondary to severe acute respiratory      syndrome.  3. Severe acute respiratory syndrome secondary to above infection.  4. Lumbar wound infection, status post recent lumbar microdiscectomy.   PLAN:  1. ICU care.  Continue current antibiotics and increase the frequency      of Zosyn to q.6 hourly at the same dose.  2. We will check sputum and blood cultures as well as rule out swine      flu and other causes.  3. We will use fluid resuscitation for any  hypotensive episodes.  4. We will replete potassium for hypokalemia.      Maryla Morrow, MD  Electronically Signed     AP/MEDQ  D:  12/19/2007  T:  12/20/2007  Job:  [id number]

## 2010-05-21 NOTE — Op Note (Signed)
[name], STORBECK             ACCOUNT NO.:  [id number]   MEDICAL RECORD NO.:  [id number]          PATIENT TYPE:  INP   LOCATION:  3040                         FACILITY:  [facility]   PHYSICIAN:  Tia Alert, MD     DATE OF BIRTH:  [birth year]   DATE OF PROCEDURE:  DATE OF DISCHARGE:                               OPERATIVE REPORT   PREOPERATIVE DIAGNOSIS:  Lumbar wound infection.   POSTOPERATIVE DIAGNOSIS:  Lumbar wound infection.   PROCEDURE:  Irrigation and debridement of lumbar wound.   SURGEON:  Tia Alert, MD   ANESTHESIA:  General endotracheal.   COMPLICATIONS:  None apparent.   INDICATIONS FOR PROCEDURE:  [name] is a 45 year old female who had a  lumbar microdiskectomy about 3 weeks ago.  She presented with fever and  drainage from her wound.  I recommended irrigation and debridement of  lumbar wound.  She understood the risks, benefits, and expected outcome  and wished to proceed.   DESCRIPTION OF PROCEDURE:  The patient was taken to the operating room  and after induction of adequate generalize endotracheal anesthesia, she  was rolled into the prone position on the Wilson frame.  All pressure  points were padded.  Her lumbar region was prepped with DuraPrep and  then draped in the usual sterile fashion.  Her old incision was opened  and the wound edges were debrided and intraoperative cultures were sent.  I then dissected down through the fascial tissues.  I found no epidural  abscess.  I irrigated with copious amounts of bacitracin-containing  saline solution.  I debrided the wound edges until I had nice bleeding  edges and then after that, I placed a subfascial and a super fascial  drain and then closed the fascia with 0 Vicryl, closed the subcutaneous  tissue with 2-0 Vicryl, and closed the subcuticular tissue with 3-0  Vicryl.  The skin was closed with benzoin and Steri-Strips.  The drapes  were removed.  Sterile dressing was applied.  The patient  awakened from  general anesthesia and transferred to the recovery room in stable  condition.  At the end of the procedure, all sponge, needle, and  instrument counts were correct.      Tia Alert, MD  Electronically Signed     DSJ/MEDQ  D:  12/18/2007  T:  12/19/2007  Job:  [id number]

## 2010-05-21 NOTE — H&P (Signed)
NAMEGERRIANNE, Chapman             ACCOUNT NO.:  [id number]   MEDICAL RECORD NO.:  [id number]          PATIENT TYPE:  INP   LOCATION:  3040                         FACILITY:  [facility]   PHYSICIAN:  Tia Alert, MD     DATE OF BIRTH:  07/15/1965   DATE OF ADMISSION:  12/18/2007  DATE OF DISCHARGE:                              HISTORY & PHYSICAL   ADMITTING DIAGNOSIS:  Lumbar wound infection.   HISTORY OF PRESENT ILLNESS:  [name] is a 45 year old female who is 3  weeks status post lumbar microdiskectomy who presents with fever, back  pain, and drainage from her wound.  She saw me on Tuesday of this past  week and had a clean, dry, and intact incision; however, she called the  following day stating that she started to have small amount of drainage  from the wound.  We had her come in to see Dr. Wynetta Emery on Thursday.  He  found her to have a small amount of drainage from the wound with  erythematous edges and put her on doxycycline.  She had a history of  MRSA infection in the past.  She also has a history of hepatitis C.  She  then called in today stating that she had increased drainage from her  wound, although her fever had defervesced.  I had her come in to see me  in the office, found her to have purulent drainage from her wound with  mild wound dehiscence and recommended irrigation and debridement of her  lumbar wound.  Therefore, she was admitted for that with plans to  culture the wound intraoperatively and then start her on culture-  directed intravenous antibiotics.   PAST MEDICAL HISTORY:  Hepatitis C, MRSA, lumbar laminectomy.   MEDICATIONS:  Neurontin, Cymbalta, hydrochlorothiazide, Xanax,  doxycycline.   ALLERGIES:  PENICILLIN.   SOCIAL HISTORY:  She admits to tobacco and marijuana use, also social  alcohol.   PHYSICAL EXAMINATION:  VITAL SIGNS:  She is febrile, pulse is in the  90s, respirations 16.  GENERAL:  Cooperative female, in no acute distress.  HEENT:   Normocephalic, atraumatic.  Extraocular movements are intact.  NECK:  Supple.  No nuchal rigidity.  HEART:  Regular rate and rhythm.  EXTREMITIES:  No obvious deformities.  NEUROLOGICAL:  She is awake and alert.  She is interactive.  No aphasia.  Good attention span.  No facial asymmetry.  Good strength throughout.  Good muscle tone, bulk and normal sensation.  Gait is normal.   IMAGING STUDIES:  She had an MRI yesterday with and without contrast  that showed no evidence of diskitis or epidural abscess or recurrent  disk herniation.   She had a recent sed rate which was 71.   ASSESSMENT AND PLAN:  This is a 45 year old female with a lumbar wound  infection with a minor wound dehiscence, this is likely methicillin-  resistant Staphylococcus aureus given her history.  We will perform  irrigation and debridement of the lumbar wound and start her on  vancomycin, rifampicin, and Zosyn, and as the cultures come  back, hopefully we will  then be able to direct her antibiotic treatment  by the results of the cultures.  Drains will be placed.  She will be  admitted for IV antibiotics.  At this point, she is nontoxic.  She had a  recent fall and is complaining of pain around the rib cage and we will  check x-rays.      Tia Alert, MD  Electronically Signed     DSJ/MEDQ  D:  12/18/2007  T:  12/19/2007  Job:  [id number]

## 2010-05-21 NOTE — Op Note (Signed)
[name], [name]             ACCOUNT NO.:  [id number]   MEDICAL RECORD NO.:  [id number]          PATIENT TYPE:  OIB   LOCATION:  3001                         FACILITY:  [facility]   PHYSICIAN:  Tia Alert, MD     DATE OF BIRTH:  November 05, 1965   DATE OF PROCEDURE:  11/29/2007  DATE OF DISCHARGE:                               OPERATIVE REPORT   PREOPERATIVE DIAGNOSIS:  Lumbar disk herniation adjacent to the L4  pedicle on the left with left L4 radiculopathy.   POSTOPERATIVE DIAGNOSIS:  Lumbar disk herniation adjacent to the L4  pedicle on the left with left L4 radiculopathy.   PROCEDURE:  Lumbar hemilaminectomy, medial facetectomy, and foraminotomy  at L3-4 and L4-5 on the left side, followed by fragmentectomy at L4 on  the left for decompression of the L4 and L5 nerve roots utilizing  microscopic dissection.   SURGEON:  Tia Alert, MD   ASSISTANT:  Donalee Citrin, MD   ANESTHESIA:  General endotracheal.   COMPLICATIONS:  None apparent.   INDICATIONS FOR PROCEDURE:  [name] is a 45 year old female who was  referred with severe left leg pain.  She had an MRI which showed a free  fragment adjacent to the pedicle at L4 on the left side, but it was  difficult to know if this came from L3-4 disk space or the L4-5 disk  space but was compressing the left L4 nerve root.  I recommended a  microdiskectomy in hopes of improving her pain syndrome.  She understood  the risks, benefits, and expected outcome and wished to proceed.   DESCRIPTION OF PROCEDURE:  The patient was taken to operating room.  After induction of adequate generalized endotracheal anesthesia, she was  rolled in prone position on the Wilson frame and all pressure points  were padded.  Her lumbar region was prepped with DuraPrep and draped in  the usual sterile fashion.  A 5 mL of local anesthesia was injected and  a dorsal midline incision was made and carried down to lumbosacral  fascia.  The fascia was opened on  the left side and taken down in  subperiosteal fashion to expose L3-4 and L4-5 on the left.  Intraoperative x-ray confirmed our level at L3-4 and then I performed a  small hemilaminotomy at this level just to get down to the yellow  ligament.  I opened the yellow ligament and then removed this to expose  the underlying dura.  I marched until I found the pedicle and I was able  to march along the pedicle to identify the L4 nerve root and just  superior to the L4 nerve root at the shoulder of the nerve root was a  large free fragment.  I continued dissecting and cleaned off the L4  nerve root.  I dissected along the pedicle wall until I was flushed with  the superior and inferior part of the pedicle and medial part of the  pedicle.  I continued my L4 laminectomy until I finished it and  identified and removed the yellow ligament to identify the L5 nerve root  and identified  the L5 pedicle.  I then was able to the bring in the  operating microscope and removed the free fragment from the shoulder of  the L4 nerve root and then L4 nerve root hung freely.  I palpated with a  coronary dilator and nerve hook into the foramen to assure adequate  decompression.  I then inspected the L3-4 disk and the L4-5 disk by  coagulating the epidural venous vasculature.  I found no evidence of  annular tear or subannular disk herniation.  Therefore, I left those  alone and left it as a simple fragmentectomy.  I then irrigated with  saline solution containing bacitracin, dried all bleeding points, lined  the dura with Gelfoam and then once hemostasis was achieved, closed the  fascia with 0 Vicryl, closed subcutaneous and subcuticular tissue with 2-  0 and 3-0 Vicryl, and closed the skin with Benzoin and Steri-Strips.  The drapes were removed.  Sterile dressing was applied.  The patient was  awakened from general anesthesia and transferred to recovery room in  stable condition.  At the end of the procedure, all  sponge, needle, and  instrument counts were correct.      Tia Alert, MD  Electronically Signed     DSJ/MEDQ  D:  11/29/2007  T:  11/30/2007  Job:  [id number]

## 2010-05-24 NOTE — Op Note (Signed)
NAMETAKAYLA, [name]             ACCOUNT NO.:  [id number]   MEDICAL RECORD NO.:  [id number]          PATIENT TYPE:  AMB   LOCATION:  DSC                          FACILITY:  [facility]   PHYSICIAN:  Artist Pais. Weingold, M.D.DATE OF BIRTH:  November 13, 1965   DATE OF PROCEDURE:  11/12/2005  DATE OF DISCHARGE:                               OPERATIVE REPORT   PREOPERATIVE DIAGNOSIS:  Left carpal tunnel syndrome.   POSTOPERATIVE DIAGNOSIS:  Left carpal tunnel syndrome.   PROCEDURE:  Left carpal tunnel release.   SURGEON:  Artist Pais. Mina Marble, M.D.   ASSISTANT:  None.   ANESTHESIA:  Regional.   TOURNIQUET TIME:  25 minutes.   COMPLICATIONS:  None.   DRAINS:  None.   DESCRIPTION OF PROCEDURE:  The patient was taken to the operative suite  after induction of regional anesthetic, left upper extremity was prepped  and draped in the usual sterile fashion. Once this was done a 2 cm  incision made in the palmar aspect of the left hand in line with the  long finger metacarpals, starting at the capsule, and curving along.  Skin was incised.  Palmar fascia was identified and split.  Distal edge  of the transverse carpal ligament was identified with a 15 blade and  split.  The median nerve was identified and protected with a Market researcher.  The remaining aspects of the transverse carpal ligament  divided under direct vision using curved scissors.  The canal is  inspected.  There were no osseous freely exposed.  It was irrigated and  closed with 3-0 Prolene subcuticular stitch.  Steri-Strips, 4 x 4s,  fluffs, compressive dressing was applied.  The patient tolerated the  procedure well.      Artist Pais Mina Marble, M.D.  Electronically Signed     MAW/MEDQ  D:  11/12/2005  T:  11/13/2005  Job:  [id number]
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
## 2010-05-24 NOTE — Discharge Summary (Signed)
   NAME:  [name], [name]                       ACCOUNT NO.:  [id number]   MEDICAL RECORD NO.:  [id number]                   PATIENT TYPE:  INP   LOCATION:  A202                                 FACILITY:  [facility]   PHYSICIAN:  Sarita Bottom, M.D.                  DATE OF BIRTH:  05/20/1965   DATE OF ADMISSION:  11/27/2001  DATE OF DISCHARGE:  11/27/2001                                 DISCHARGE SUMMARY   HISTORY OF PRESENT ILLNESS:  The patient is a 45 year old lady who was  admitted overnight when she presented to the emergency room after she had  taken an overdose of Xanax, Lexapro, and Neurontin.  Physical examination on  admission was significant for a lethargic lady with tachycardia, mild  bilateral expiratory wheezes.  No gross or focal neurological deficits.  Her  admission laboratory examination showed a hemoglobin of 14.8.  The potassium  was 3.0.   HOSPITAL COURSE:  She was admitted with an impression of drug overdose, and  she was put on telemetry with frequent neurological checks.  She was given  albuterol nebulizer and IV KCl for low potassium.  The patient had been seen  by Behavioral Health who recommended transfer to [facility] under the care of Dr. Renne Crigler for treatment for depression and  suicidal ideation.  The patient has been seen today on rounds, and vital  signs are stable.  She is a young lady in no apparent distress.  HEENT:  She  is not pale.  She is anicteric.  Neck is supple.  Chest is clear to  auscultation.  CVS:  Heart sounds 1 and 2 are normal.  Regular rhythm and  rate.  CNS:  She is alert and oriented x 3.  No focal deficits.   DISCHARGE DIAGNOSES:  1. Drug overdose with depression and suicidal ideation.  2. Seizure disorder.  3. Asthma.  4. Hypokalemia.  5. Cirrhosis of the liver.   DISCHARGE MEDICATIONS:  Neurontin.  Xanax.  Aldactone.  Carbitol.   CONDITION ON DISCHARGE:  Stable and satisfactory.   DISCHARGE INSTRUCTIONS:   Resume antiseizure medication.  Monitor correction  of the patient's hypokalemia.  Treatment with albuterol MDI for asthma.                                               Sarita Bottom, M.D.    DW/MEDQ  D:  11/27/2001  T:  11/27/2001  Job:  [id number]
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
## 2010-05-24 NOTE — H&P (Signed)
NAME:  [name], [name]                       ACCOUNT NO.:  [id number]   MEDICAL RECORD NO.:  [id number]                   PATIENT TYPE:  INP   LOCATION:  A202                                 FACILITY:  [facility]   PHYSICIAN:  Gracelyn Nurse, M.D.              DATE OF BIRTH:  [birth year]   DATE OF ADMISSION:  11/27/2001  DATE OF DISCHARGE:                                HISTORY & PHYSICAL   CHIEF COMPLAINT:  Drug overdose.   HISTORY OF PRESENT ILLNESS:  This is a 45 year old white female who presents  after taking a drug overdose.  She states she took a hand full of Xanax,  Lexapro, and Neurontin - about 20 to 30 tablets - around 7 p.m.  She then  called her therapist who in turn called EMS.  She says she wanted to end it  all.  She is currently lethargic but able to answer questions.  She does  have a past history of a suicide attempt before.   PAST MEDICAL HISTORY:  1. History of suicide attempt.  2. Depression.  3. Asthma.  4. Hepatitis C.  5. Hepatic cirrhosis.  6. Seizure disorder.  7. Status post partial hysterectomy.   ALLERGIES:  KELFLEX, PROZAC, and TOFANOL.   CURRENT MEDICATIONS:  1. Carbatrol 200 mg b.i.d.  2. Spironolactone 25 mg q.d.  3. Xanax 2 mg one-half b.i.d.  4. Neurontin 600 mg two b.i.d.   SOCIAL HISTORY:  She smokes about a pack of cigarettes a day, does not drink  alcohol.  Is single with one child.   FAMILY HISTORY:  Mother has hypertension.  Father has arthritis.   REVIEW OF SYSTEMS:  As per HPI.  All other systems are normal.   VITAL SIGNS:  Pulse 94, respirations 20, blood pressure 116/65, O2  saturation 95% on room air.   PHYSICAL EXAMINATION:  GENERAL:  This is a well-nourished white female who  is slightly lethargic.  HEENT:  Pupils are dilated but reactive.  Extraocular movement intact.  Oral  mucosa is moist.  Oropharynx is clear.  CARDIOVASCULAR:  Tachycardic with no murmurs.  LUNGS:  There are mild expiratory wheezes.  ABDOMEN:   Soft, nontender, nondistended, bowel sounds positive.  EXTREMITIES:  No edema.  NEUROLOGIC:  She is lethargic but able to answer questions.  Cranial nerves  II-XII appear to be grossly intact.  She moves all extremities.  SKIN:  Moist with no rash.   ADMISSION LABORATORY DATA:  White blood cells 11.3, hemoglobin 14.8,  platelets 289. INR 0.8.  CMET and urine drug screen pending at time of  dictation.   ASSESSMENT AND PLAN:  1. Drug overdose.  Will admit to telemetry with suicide precaution.  Will     give supportive care, frequent neurologic checks, and vital sign checks.     Will hold all her medications for now.  Will follow up on urine drug  screen to rule out any other type medications that she is not telling us     about.  2. Suicide attempt.  She does have past history of this.  Will get an ACT     team consult whenever she is medically stable and have them assist [place]     with setting disposition.  3. Seizure disorder.  We are holding her medications for now because of the     overdose may have involved some of her anti-seizure medications. Will     watch her for seizures and probably restart her medications when she is     medically stable.  4. Mild asthma exacerbation.  She does have expiratory wheezing on exam.     Her chest x-ray looks clear.  I will go ahead and start her on some     albuterol nebulizers.                                               Gracelyn Nurse, M.D.    JDJ/MEDQ  D:  11/27/2001  T:  11/27/2001  Job:  [id number]

## 2010-05-24 NOTE — Discharge Summary (Signed)
NAME:  [name], [name]                       ACCOUNT NO.:  [id number]   MEDICAL RECORD NO.:  [id number]                   PATIENT TYPE:  INP   LOCATION:  A214                                 FACILITY:  [facility]   PHYSICIAN:  Hanley Hays. Dechurch, M.D.           DATE OF BIRTH:  Apr 17, 1965   DATE OF ADMISSION:  DATE OF DISCHARGE:  04/13/2003                                 DISCHARGE SUMMARY   DIAGNOSES:  1. Intentional overdose of Xanax.  2. Suicidal ideation.  3. Chronic obstructive pulmonary disease.  4. Ongoing tobacco abuse.  5. Hepatitis C.  6. History of cirrhosis.  7. Hypokalemia.   DISPOSITION:  The patient is awaiting transfer to [facility]  under involuntary commitment.   HOSPITAL COURSE:  A 45 year old Caucasian female who intentionally ingested  an unknown quantity, approximately 50, Xanax over the last 24-48 hours.  She  presented to the emergency room stuporous which was followed by delirium as  she became more awake, and now just frank uncooperation.  She states she is  going home and attempting to sign out AMA so that she could go home and  finish the job.  She states she has plans for her child to be cared for  __________her therapist, and has little insight to the situation at hand.  She medically is stable.  She has been seen by the ACT team, and has been  accepted to [facility] for ongoing management.  It also  should be noted the patient has polysubstance abuse with THC being noted on  her drug screen.  She is clinically stable.  She does not answer any  questions for me today.  She is quite uncooperative.  She does not allow an  exam.   She was able to get to the bathroom, eat breakfast, and go outside to smoke.  Therefore, she is clinically stable to leave the acute-care hospital.     ___________________________________________                                         Hanley Hays. Josefine Class, M.D.   FED/MEDQ  D:  04/13/2003   T:  04/14/2003  Job:  [id number]
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
## 2010-10-04 LAB — DIFFERENTIAL
Eosinophils Absolute: 0.3
Eosinophils Relative: 2
Lymphocytes Relative: 33
Lymphs Abs: 4.7 — ABNORMAL HIGH
Monocytes Relative: 11
Neutrophils Relative %: 54

## 2010-10-04 LAB — URINALYSIS, ROUTINE W REFLEX MICROSCOPIC
Bilirubin Urine: NEGATIVE
Glucose, UA: NEGATIVE
Protein, ur: NEGATIVE

## 2010-10-04 LAB — BASIC METABOLIC PANEL
Calcium: 9.3
Chloride: 99
Creatinine, Ser: 0.72
GFR calc Af Amer: >60
Sodium: 135

## 2010-10-04 LAB — URINE MICROSCOPIC-ADD ON

## 2010-10-04 LAB — CBC
HCT: 45.7
MCV: 95.2
RBC: 4.8
WBC: 14.2 — ABNORMAL HIGH

## 2010-10-08 LAB — COMPREHENSIVE METABOLIC PANEL
AST: 42 — ABNORMAL HIGH
Albumin: 3.5
Albumin: 4.2
Alkaline Phosphatase: 81
Alkaline Phosphatase: 83
BUN: 11
BUN: 2 — ABNORMAL LOW
Chloride: 102
Chloride: 103
Creatinine, Ser: 0.64
GFR calc Af Amer: >60
GFR calc non Af Amer: >60
Glucose, Bld: 102 — ABNORMAL HIGH
Potassium: 4.2
Sodium: 138
Total Bilirubin: 0.5
Total Bilirubin: 1.2
Total Protein: 7.4

## 2010-10-08 LAB — DIFFERENTIAL
Basophils Absolute: 0
Basophils Absolute: 0.1
Basophils Relative: 0
Basophils Relative: 1
Eosinophils Relative: 3
Lymphocytes Relative: 31
Lymphocytes Relative: 37
Monocytes Absolute: 0.7
Monocytes Absolute: 1.2 — ABNORMAL HIGH
Monocytes Relative: 7
Neutro Abs: 4
Neutro Abs: 5.3
Neutrophils Relative %: 48

## 2010-10-08 LAB — CBC
HCT: 43.4
Hemoglobin: 15.1 — ABNORMAL HIGH
MCV: 96.7
Platelets: 327
Platelets: 365
RDW: 14.4
WBC: 10.2
WBC: 8.4

## 2010-10-08 LAB — PROTIME-INR
INR: 0.9
Prothrombin Time: 11.8

## 2010-10-08 LAB — APTT
aPTT: 26
aPTT: 27

## 2010-10-11 LAB — WOUND CULTURE

## 2010-10-11 LAB — CULTURE, BLOOD (ROUTINE X 2)

## 2010-10-11 LAB — BASIC METABOLIC PANEL
BUN: 4 mg/dL — ABNORMAL LOW (ref 6–23)
BUN: 4 mg/dL — ABNORMAL LOW (ref 6–23)
BUN: 5 mg/dL — ABNORMAL LOW (ref 6–23)
BUN: 5 mg/dL — ABNORMAL LOW (ref 6–23)
BUN: 5 mg/dL — ABNORMAL LOW (ref 6–23)
BUN: 5 mg/dL — ABNORMAL LOW (ref 6–23)
BUN: 5 mg/dL — ABNORMAL LOW (ref 6–23)
BUN: 6 mg/dL (ref 6–23)
BUN: 8 mg/dL (ref 6–23)
CO2: 22 mEq/L (ref 19–32)
CO2: 22 mEq/L (ref 19–32)
CO2: 24 mEq/L (ref 19–32)
CO2: 24 mEq/L (ref 19–32)
CO2: 25 mEq/L (ref 19–32)
CO2: 29 mEq/L (ref 19–32)
CO2: 29 mEq/L (ref 19–32)
CO2: 29 mEq/L (ref 19–32)
CO2: 32 mEq/L (ref 19–32)
CO2: 33 mEq/L — ABNORMAL HIGH (ref 19–32)
Calcium: 7.7 mg/dL — ABNORMAL LOW (ref 8.4–10.5)
Calcium: 7.8 mg/dL — ABNORMAL LOW (ref 8.4–10.5)
Calcium: 7.9 mg/dL — ABNORMAL LOW (ref 8.4–10.5)
Calcium: 7.9 mg/dL — ABNORMAL LOW (ref 8.4–10.5)
Calcium: 7.9 mg/dL — ABNORMAL LOW (ref 8.4–10.5)
Calcium: 7.9 mg/dL — ABNORMAL LOW (ref 8.4–10.5)
Calcium: 8.1 mg/dL — ABNORMAL LOW (ref 8.4–10.5)
Chloride: 100 mEq/L (ref 96–112)
Chloride: 103 mEq/L (ref 96–112)
Chloride: 104 mEq/L (ref 96–112)
Chloride: 109 mEq/L (ref 96–112)
Chloride: 110 mEq/L (ref 96–112)
Chloride: 86 mEq/L — ABNORMAL LOW (ref 96–112)
Chloride: 87 mEq/L — ABNORMAL LOW (ref 96–112)
Chloride: 91 mEq/L — ABNORMAL LOW (ref 96–112)
Chloride: 92 mEq/L — ABNORMAL LOW (ref 96–112)
Chloride: 95 mEq/L — ABNORMAL LOW (ref 96–112)
Creatinine, Ser: 0.5 mg/dL (ref 0.4–1.2)
Creatinine, Ser: 0.5 mg/dL (ref 0.4–1.2)
Creatinine, Ser: 0.54 mg/dL (ref 0.4–1.2)
Creatinine, Ser: 0.58 mg/dL (ref 0.4–1.2)
Creatinine, Ser: 0.6 mg/dL (ref 0.4–1.2)
Creatinine, Ser: 0.64 mg/dL (ref 0.4–1.2)
Creatinine, Ser: 0.69 mg/dL (ref 0.4–1.2)
Creatinine, Ser: 0.73 mg/dL (ref 0.4–1.2)
GFR calc Af Amer: >60 mL/min (ref >60)
GFR calc Af Amer: >60 mL/min (ref >60)
GFR calc Af Amer: >60 mL/min (ref >60)
GFR calc Af Amer: >60 mL/min (ref >60)
GFR calc non Af Amer: >60 mL/min (ref >60)
GFR calc non Af Amer: >60 mL/min (ref >60)
GFR calc non Af Amer: >60 mL/min (ref >60)
GFR calc non Af Amer: >60 mL/min (ref >60)
Glucose, Bld: 110 mg/dL — ABNORMAL HIGH (ref 70–99)
Glucose, Bld: 124 mg/dL — ABNORMAL HIGH (ref 70–99)
Glucose, Bld: 135 mg/dL — ABNORMAL HIGH (ref 70–99)
Glucose, Bld: 155 mg/dL — ABNORMAL HIGH (ref 70–99)
Glucose, Bld: 161 mg/dL — ABNORMAL HIGH (ref 70–99)
Glucose, Bld: 96 mg/dL (ref 70–99)
Glucose, Bld: 99 mg/dL (ref 70–99)
Potassium: 2.2 mEq/L — CL (ref 3.5–5.1)
Potassium: 2.4 mEq/L — CL (ref 3.5–5.1)
Potassium: 3.2 mEq/L — ABNORMAL LOW (ref 3.5–5.1)
Potassium: 3.5 mEq/L (ref 3.5–5.1)
Potassium: 4 mEq/L (ref 3.5–5.1)
Potassium: 4.1 mEq/L (ref 3.5–5.1)
Sodium: 133 mEq/L — ABNORMAL LOW (ref 135–145)
Sodium: 136 mEq/L (ref 135–145)
Sodium: 137 mEq/L (ref 135–145)

## 2010-10-11 LAB — CBC
HCT: 28.2 % — ABNORMAL LOW (ref 36.0–46.0)
HCT: 31.1 % — ABNORMAL LOW (ref 36.0–46.0)
HCT: 39.7 % (ref 36.0–46.0)
Hemoglobin: 13.1 g/dL (ref 12.0–15.0)
Hemoglobin: 13.4 g/dL (ref 12.0–15.0)
MCHC: 33.5 g/dL (ref 30.0–36.0)
MCHC: 34.3 g/dL (ref 30.0–36.0)
MCHC: 34.5 g/dL (ref 30.0–36.0)
MCHC: 34.5 g/dL (ref 30.0–36.0)
MCHC: 35 g/dL (ref 30.0–36.0)
MCHC: 35 g/dL (ref 30.0–36.0)
MCV: 95.9 fL (ref 78.0–100.0)
MCV: 97.2 fL (ref 78.0–100.0)
MCV: 97.3 fL (ref 78.0–100.0)
MCV: 97.9 fL (ref 78.0–100.0)
MCV: 98.2 fL (ref 78.0–100.0)
Platelets: 116 10*3/uL — ABNORMAL LOW (ref 150–400)
Platelets: 137 10*3/uL — ABNORMAL LOW (ref 150–400)
Platelets: 401 10*3/uL — ABNORMAL HIGH (ref 150–400)
Platelets: 478 10*3/uL — ABNORMAL HIGH (ref 150–400)
RBC: 2.88 MIL/uL — ABNORMAL LOW (ref 3.87–5.11)
RBC: 3.92 MIL/uL (ref 3.87–5.11)
RBC: 3.95 MIL/uL (ref 3.87–5.11)
RBC: 4.06 MIL/uL (ref 3.87–5.11)
RDW: 14.5 % (ref 11.5–15.5)
RDW: 14.5 % (ref 11.5–15.5)
RDW: 14.9 % (ref 11.5–15.5)
WBC: 12.8 10*3/uL — ABNORMAL HIGH (ref 4.0–10.5)
WBC: 6.6 10*3/uL (ref 4.0–10.5)
WBC: 8.6 10*3/uL (ref 4.0–10.5)
WBC: 9.2 10*3/uL (ref 4.0–10.5)

## 2010-10-11 LAB — GLUCOSE, CAPILLARY
Glucose-Capillary: 100 mg/dL — ABNORMAL HIGH (ref 70–99)
Glucose-Capillary: 101 mg/dL — ABNORMAL HIGH (ref 70–99)
Glucose-Capillary: 114 mg/dL — ABNORMAL HIGH (ref 70–99)
Glucose-Capillary: 115 mg/dL — ABNORMAL HIGH (ref 70–99)
Glucose-Capillary: 121 mg/dL — ABNORMAL HIGH (ref 70–99)
Glucose-Capillary: 122 mg/dL — ABNORMAL HIGH (ref 70–99)
Glucose-Capillary: 125 mg/dL — ABNORMAL HIGH (ref 70–99)
Glucose-Capillary: 127 mg/dL — ABNORMAL HIGH (ref 70–99)
Glucose-Capillary: 127 mg/dL — ABNORMAL HIGH (ref 70–99)
Glucose-Capillary: 129 mg/dL — ABNORMAL HIGH (ref 70–99)
Glucose-Capillary: 130 mg/dL — ABNORMAL HIGH (ref 70–99)
Glucose-Capillary: 131 mg/dL — ABNORMAL HIGH (ref 70–99)
Glucose-Capillary: 132 mg/dL — ABNORMAL HIGH (ref 70–99)
Glucose-Capillary: 133 mg/dL — ABNORMAL HIGH (ref 70–99)
Glucose-Capillary: 138 mg/dL — ABNORMAL HIGH (ref 70–99)
Glucose-Capillary: 149 mg/dL — ABNORMAL HIGH (ref 70–99)
Glucose-Capillary: 158 mg/dL — ABNORMAL HIGH (ref 70–99)
Glucose-Capillary: 188 mg/dL — ABNORMAL HIGH (ref 70–99)
Glucose-Capillary: 214 mg/dL — ABNORMAL HIGH (ref 70–99)
Glucose-Capillary: 89 mg/dL (ref 70–99)
Glucose-Capillary: 95 mg/dL (ref 70–99)

## 2010-10-11 LAB — BLOOD GAS, ARTERIAL
Acid-Base Excess: 7.6 mmol/L — ABNORMAL HIGH (ref 0.0–2.0)
TCO2: 32.8 mmol/L (ref 0–100)
pCO2 arterial: 43.5 mmHg (ref 35.0–45.0)
pO2, Arterial: 56.4 mmHg — ABNORMAL LOW (ref 80.0–100.0)

## 2010-10-11 LAB — PROTIME-INR
INR: 1.1 (ref 0.00–1.49)
Prothrombin Time: 14.3 seconds (ref 11.6–15.2)

## 2010-10-11 LAB — COMPREHENSIVE METABOLIC PANEL
ALT: 19 U/L (ref 0–35)
AST: 23 U/L (ref 0–37)
Alkaline Phosphatase: 198 U/L — ABNORMAL HIGH (ref 39–117)
CO2: 23 mEq/L (ref 19–32)
Glucose, Bld: 114 mg/dL — ABNORMAL HIGH (ref 70–99)
Potassium: 2.9 mEq/L — ABNORMAL LOW (ref 3.5–5.1)
Sodium: 127 mEq/L — ABNORMAL LOW (ref 135–145)
Total Protein: 6.1 g/dL (ref 6.0–8.3)

## 2010-10-11 LAB — CULTURE, RESPIRATORY W GRAM STAIN: Culture: NO GROWTH

## 2010-10-11 LAB — AFB CULTURE WITH SMEAR (NOT AT ARMC): Acid Fast Smear: NONE SEEN

## 2010-10-11 LAB — URINALYSIS, ROUTINE W REFLEX MICROSCOPIC
Glucose, UA: NEGATIVE mg/dL
Glucose, UA: NEGATIVE mg/dL
Hgb urine dipstick: NEGATIVE
Hgb urine dipstick: NEGATIVE
Protein, ur: 30 mg/dL — AB
Specific Gravity, Urine: 1.023 (ref 1.005–1.030)
pH: 5.5 (ref 5.0–8.0)

## 2010-10-11 LAB — BODY FLUID CELL COUNT WITH DIFFERENTIAL: Total Nucleated Cell Count, Fluid: 210 cu mm (ref 0–1000)

## 2010-10-11 LAB — URINE MICROSCOPIC-ADD ON

## 2010-10-11 LAB — GLUCOSE, SEROUS FLUID: Glucose, Fluid: 69 mg/dL

## 2010-10-11 LAB — BODY FLUID CULTURE: Culture: NO GROWTH

## 2010-10-11 LAB — HEPATIC FUNCTION PANEL
ALT: 34 U/L (ref 0–35)
Albumin: 2.2 g/dL — ABNORMAL LOW (ref 3.5–5.2)
Alkaline Phosphatase: 190 U/L — ABNORMAL HIGH (ref 39–117)
Indirect Bilirubin: 1.8 mg/dL — ABNORMAL HIGH (ref 0.3–0.9)
Total Protein: 5.4 g/dL — ABNORMAL LOW (ref 6.0–8.3)

## 2010-10-11 LAB — GRAM STAIN: Gram Stain: NONE SEEN

## 2010-10-11 LAB — ANAEROBIC CULTURE: Gram Stain: NONE SEEN

## 2010-10-11 LAB — CALCIUM, IONIZED: Calcium, Ion: 1.12 mmol/L (ref 1.12–1.32)

## 2010-10-11 LAB — PROTEIN, BODY FLUID: Total protein, fluid: 4.3 g/dL

## 2010-10-11 LAB — PHOSPHORUS
Phosphorus: 2.3 mg/dL (ref 2.3–4.6)
Phosphorus: 2.5 mg/dL (ref 2.3–4.6)

## 2010-10-11 LAB — FUNGUS CULTURE W SMEAR

## 2010-10-11 LAB — MAGNESIUM
Magnesium: 2 mg/dL (ref 1.5–2.5)
Magnesium: 2.3 mg/dL (ref 1.5–2.5)

## 2010-10-11 LAB — POCT I-STAT 3, ART BLOOD GAS (G3+)
Acid-Base Excess: 10 mmol/L — ABNORMAL HIGH (ref 0.0–2.0)
O2 Saturation: 94 %
Patient temperature: 98.4

## 2010-10-11 LAB — DIFFERENTIAL
Eosinophils Absolute: 0 10*3/uL (ref 0.0–0.7)
Eosinophils Relative: 1 % (ref 0–5)
Lymphocytes Relative: 12 % (ref 12–46)
Lymphs Abs: 0.8 10*3/uL (ref 0.7–4.0)
Monocytes Absolute: 0.6 10*3/uL (ref 0.1–1.0)
Monocytes Relative: 9 % (ref 3–12)

## 2010-10-11 LAB — URINE CULTURE: Colony Count: NO GROWTH

## 2010-10-11 LAB — LACTATE DEHYDROGENASE, PLEURAL OR PERITONEAL FLUID

## 2010-10-11 LAB — EXPECTORATED SPUTUM ASSESSMENT W GRAM STAIN, RFLX TO RESP C

## 2010-10-11 LAB — TROPONIN I: Troponin I: 0.04 ng/mL (ref 0.00–0.06)

## 2010-10-11 LAB — CK: Total CK: 43 U/L (ref 7–177)

## 2010-10-11 LAB — PH, BODY FLUID

## 2010-10-11 LAB — LACTIC ACID, PLASMA: Lactic Acid, Venous: 1.9 mmol/L (ref 0.5–2.2)

## 2010-10-18 LAB — COMPREHENSIVE METABOLIC PANEL
Albumin: 3.6
BUN: 8
Calcium: 8.8
Glucose, Bld: 90
Potassium: 3.6
Total Protein: 6.5

## 2010-10-18 LAB — DIFFERENTIAL
Lymphocytes Relative: 34
Lymphs Abs: 4.4 — ABNORMAL HIGH
Monocytes Absolute: 1 — ABNORMAL HIGH
Monocytes Relative: 8
Neutro Abs: 7.3

## 2010-10-18 LAB — CBC
HCT: 41
Hemoglobin: 14.4
MCHC: 35.1
Platelets: 295
RDW: 12.7

## 2010-10-18 LAB — POCT CARDIAC MARKERS
CKMB, poc: <1 — ABNORMAL LOW
Troponin i, poc: <0.05

## 2010-10-18 LAB — D-DIMER, QUANTITATIVE: D-Dimer, Quant: 0.7 — ABNORMAL HIGH

## 2011-01-10 ENCOUNTER — Emergency Department (HOSPITAL_COMMUNITY)
Admission: EM | Admit: 2011-01-10 | Discharge: 2011-01-10 | Disposition: A | Discharge status: Home / Self Care | Payer: Medicaid Other | Attending: Emergency Medicine | Admitting: Emergency Medicine

## 2011-01-10 ENCOUNTER — Encounter: Payer: Self-pay | Admitting: Emergency Medicine

## 2011-01-10 DIAGNOSIS — K739 Chronic hepatitis, unspecified: Secondary | ICD-10-CM | POA: Insufficient documentation

## 2011-01-10 DIAGNOSIS — I10 Essential (primary) hypertension: Secondary | ICD-10-CM | POA: Insufficient documentation

## 2011-01-10 DIAGNOSIS — F17200 Nicotine dependence, unspecified, uncomplicated: Secondary | ICD-10-CM | POA: Insufficient documentation

## 2011-01-10 DIAGNOSIS — Z9079 Acquired absence of other genital organ(s): Secondary | ICD-10-CM | POA: Insufficient documentation

## 2011-01-10 DIAGNOSIS — J45909 Unspecified asthma, uncomplicated: Secondary | ICD-10-CM | POA: Insufficient documentation

## 2011-01-10 DIAGNOSIS — H6690 Otitis media, unspecified, unspecified ear: Secondary | ICD-10-CM

## 2011-01-10 DIAGNOSIS — J40 Bronchitis, not specified as acute or chronic: Secondary | ICD-10-CM | POA: Insufficient documentation

## 2011-01-10 DIAGNOSIS — J329 Chronic sinusitis, unspecified: Secondary | ICD-10-CM | POA: Insufficient documentation

## 2011-01-10 DIAGNOSIS — E119 Type 2 diabetes mellitus without complications: Secondary | ICD-10-CM | POA: Insufficient documentation

## 2011-01-10 HISTORY — DX: Chronic obstructive pulmonary disease, unspecified: J44.9

## 2011-01-10 HISTORY — DX: Essential (primary) hypertension: I10

## 2011-01-10 HISTORY — DX: Chronic viral hepatitis C: B18.2

## 2011-01-10 HISTORY — DX: Malignant (primary) neoplasm, unspecified: C80.1

## 2011-01-10 MED ORDER — AMOXICILLIN 500 MG PO CAPS: 500.0000 mg | ORAL_CAPSULE | Freq: Three times a day (TID) | ORAL | Status: AC

## 2011-01-10 MED ORDER — ALBUTEROL SULFATE (5 MG/ML) 0.5% IN NEBU
5.0000 mg | INHALATION_SOLUTION | Freq: Once | RESPIRATORY_TRACT | Status: AC
  Administered 2011-01-10: 5 mg via RESPIRATORY_TRACT
  Filled 2011-01-10: qty 1

## 2011-01-10 MED ORDER — PREDNISONE 10 MG PO TABS: 20.0000 mg | ORAL_TABLET | Freq: Every day | ORAL | Status: DC

## 2011-01-10 MED ORDER — IPRATROPIUM BROMIDE 0.02 % IN SOLN
0.5000 mg | Freq: Once | RESPIRATORY_TRACT | Status: AC
  Administered 2011-01-10: 0.5 mg via RESPIRATORY_TRACT
  Filled 2011-01-10: qty 2.5

## 2011-01-10 MED ORDER — BENZONATATE 100 MG PO CAPS: 100.0000 mg | ORAL_CAPSULE | Freq: Three times a day (TID) | ORAL | Status: AC

## 2011-01-10 MED ORDER — PREDNISONE 20 MG PO TABS
60.0000 mg | ORAL_TABLET | Freq: Once | ORAL | Status: AC
  Administered 2011-01-10: 60 mg via ORAL
  Filled 2011-01-10: qty 3

## 2011-01-10 NOTE — ED Provider Notes (Signed)
History     CSN: [id number]  Arrival date & time 01/10/11  2010   First MD Initiated Contact with Patient 01/10/11 2037      Chief Complaint  Patient presents with  . Fever  . Otalgia  . Eye Problem    (Consider location/radiation/quality/duration/timing/severity/associated sxs/prior treatment) HPI Patient presents emergency room with complaints of right ear pain and pain around her right eye and sinus for the last week and a half. Patient states she's had a cough ongoing for the last week. She's been having ear pressure and pain and feel like she can't hear out of her ear properly. Patient does have history of asthma and COPD and she does continue to smoke. Patient states she has been using her inhalers for the cough persists. Patient also started taking doxycycline a few days ago that she borrowed from a friend. Patient has not had any abdominal pain. No vomiting or diarrhea Past Medical History  Diagnosis Date  . Asthma   . COPD (chronic obstructive pulmonary disease)   . Hep C w/o coma, chronic   . Cancer   . Hypertension   . Diabetes mellitus     Past Surgical History  Procedure Date  . Abdominal hysterectomy   . Back surgery     No family history on file.  History  Substance Use Topics  . Smoking status: Current Everyday Smoker -- 0.5 packs/day    Types: Cigarettes  . Smokeless tobacco: Not on file  . Alcohol Use: Yes    OB History    Grav Para Term Preterm Abortions TAB SAB Ect Mult Living                  Review of Systems  All other systems reviewed and are negative.    Allergies  Codeine and Keflex  Home Medications   Current Outpatient Rx  Name Route Sig Dispense Refill  . AMOXICILLIN 500 MG PO CAPS Oral Take 1 capsule (500 mg total) by mouth 3 (three) times daily. 21 capsule 0  . BENZONATATE 100 MG PO CAPS Oral Take 1 capsule (100 mg total) by mouth every 8 (eight) hours. 21 capsule 0  . PREDNISONE 10 MG PO TABS Oral Take 2 tablets (20 mg  total) by mouth daily. 15 tablet 0    BP 104/64  Pulse 114  Temp(Src) 98.8 F (37.1 C) (Oral)  Resp 20  Ht 5\' 1"  (1.549 m)  Wt 185 lb (83.915 kg)  BMI 34.96 kg/m2  SpO2 97%  Physical Exam  Nursing note and vitals reviewed. Constitutional: She appears well-developed and well-nourished. No distress.  HENT:  Head: Normocephalic and atraumatic. Head is without raccoon's eyes and without Battle's sign.  Right Ear: External ear normal. No tenderness. Tympanic membrane is injected and erythematous. A middle ear effusion is present.  Left Ear: External ear normal.  Mouth/Throat: Uvula is midline, oropharynx is clear and moist and mucous membranes are normal.       Right maxillary sinus tenderness palpation  Eyes: Conjunctivae are normal. Right eye exhibits no discharge. Left eye exhibits no discharge. No scleral icterus.  Neck: Neck supple. No tracheal deviation present.  Cardiovascular: Normal rate, regular rhythm and intact distal pulses.   Pulmonary/Chest: Effort normal. No stridor. No respiratory distress. She has wheezes. She has no rales.       Few wheezes at the bilateral bases, no tachypnea or retractions  Abdominal: Soft. Bowel sounds are normal. She exhibits no distension. There is no tenderness.  There is no rebound and no guarding.  Musculoskeletal: She exhibits no edema and no tenderness.  Neurological: She is alert. She has normal strength. No sensory deficit. Cranial nerve deficit:  no gross defecits noted. She exhibits normal muscle tone. She displays no seizure activity. Coordination normal.  Skin: Skin is warm and dry. No rash noted.  Psychiatric: She has a normal mood and affect.    ED Course  Procedures (including critical care time)  Labs Reviewed - No data to display No results found.   1. Sinusitis   2. Otitis media   3. Bronchitis       MDM   patient's symptoms are suggestive of sinusitis as well as otitis media. She does have history of smoking history  and appears to have a recurrent bronchitis. Otherwise I do not hear any evidence to suggest pneumonia. Patient will be started antibiotics. I also have her start a course of steroids to continue with her inhalers that she has at home for the wheezing I note on exam. Patient has been encouraged to stop smoking. She does indicate that she can take amoxicillin despite her Keflex allergy.        [name], MD 01/10/11 2103

## 2011-01-10 NOTE — ED Notes (Signed)
Pt states has been sick x 2 weeks with URI symptoms, continues to have residual effects from viral infection.  Rt ear is painful, sore throat is minimal at this time, and pt continues to have weakness and dizziness.  Pt has flushed cheeks and is sleepy

## 2011-01-10 NOTE — ED Notes (Signed)
Nurse aide contact information, Fawn Kirk, phone number: [phone].

## 2011-01-10 NOTE — ED Notes (Signed)
Patient states she has been sick with fever, ear pain and eye pain x 1 1/2 weeks.  States also has a cough.

## 2012-09-25 ENCOUNTER — Emergency Department (HOSPITAL_COMMUNITY)
Admission: EM | Admit: 2012-09-25 | Discharge: 2012-09-26 | Disposition: A | Discharge status: Home / Self Care | Payer: Medicaid Other | Attending: Emergency Medicine | Admitting: Emergency Medicine

## 2012-09-25 ENCOUNTER — Emergency Department (HOSPITAL_COMMUNITY): Payer: Medicaid Other

## 2012-09-25 ENCOUNTER — Encounter (HOSPITAL_COMMUNITY): Payer: Self-pay | Admitting: Emergency Medicine

## 2012-09-25 DIAGNOSIS — R079 Chest pain, unspecified: Secondary | ICD-10-CM | POA: Insufficient documentation

## 2012-09-25 DIAGNOSIS — J449 Chronic obstructive pulmonary disease, unspecified: Secondary | ICD-10-CM | POA: Insufficient documentation

## 2012-09-25 DIAGNOSIS — Y9389 Activity, other specified: Secondary | ICD-10-CM | POA: Insufficient documentation

## 2012-09-25 DIAGNOSIS — F17200 Nicotine dependence, unspecified, uncomplicated: Secondary | ICD-10-CM | POA: Insufficient documentation

## 2012-09-25 DIAGNOSIS — Z79899 Other long term (current) drug therapy: Secondary | ICD-10-CM | POA: Insufficient documentation

## 2012-09-25 DIAGNOSIS — S60229A Contusion of unspecified hand, initial encounter: Secondary | ICD-10-CM | POA: Insufficient documentation

## 2012-09-25 DIAGNOSIS — Z8701 Personal history of pneumonia (recurrent): Secondary | ICD-10-CM | POA: Insufficient documentation

## 2012-09-25 DIAGNOSIS — E119 Type 2 diabetes mellitus without complications: Secondary | ICD-10-CM | POA: Insufficient documentation

## 2012-09-25 DIAGNOSIS — I10 Essential (primary) hypertension: Secondary | ICD-10-CM | POA: Insufficient documentation

## 2012-09-25 DIAGNOSIS — M940 Chondrocostal junction syndrome [Tietze]: Secondary | ICD-10-CM | POA: Insufficient documentation

## 2012-09-25 DIAGNOSIS — W010XXA Fall on same level from slipping, tripping and stumbling without subsequent striking against object, initial encounter: Secondary | ICD-10-CM | POA: Insufficient documentation

## 2012-09-25 DIAGNOSIS — Y929 Unspecified place or not applicable: Secondary | ICD-10-CM | POA: Insufficient documentation

## 2012-09-25 DIAGNOSIS — S8000XA Contusion of unspecified knee, initial encounter: Secondary | ICD-10-CM | POA: Insufficient documentation

## 2012-09-25 DIAGNOSIS — S8001XA Contusion of right knee, initial encounter: Secondary | ICD-10-CM

## 2012-09-25 DIAGNOSIS — J4489 Other specified chronic obstructive pulmonary disease: Secondary | ICD-10-CM | POA: Insufficient documentation

## 2012-09-25 DIAGNOSIS — Z880 Allergy status to penicillin: Secondary | ICD-10-CM | POA: Insufficient documentation

## 2012-09-25 DIAGNOSIS — S60221A Contusion of right hand, initial encounter: Secondary | ICD-10-CM

## 2012-09-25 DIAGNOSIS — IMO0002 Reserved for concepts with insufficient information to code with codable children: Secondary | ICD-10-CM | POA: Insufficient documentation

## 2012-09-25 HISTORY — DX: Pneumonia, unspecified organism: J18.9

## 2012-09-25 MED ORDER — OXYCODONE-ACETAMINOPHEN 5-325 MG PO TABS: 1.0000 | ORAL_TABLET | ORAL | Status: DC | PRN

## 2012-09-25 MED ORDER — OXYCODONE-ACETAMINOPHEN 5-325 MG PO TABS
1.0000 | ORAL_TABLET | Freq: Once | ORAL | Status: AC
  Administered 2012-09-25: 1 via ORAL
  Filled 2012-09-25: qty 1
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
## 2012-09-25 NOTE — ED Notes (Signed)
Patient reports tripped and fell into wood pile. Complaining of pain to right knee, right hand, and left chest pain.

## 2012-09-26 NOTE — ED Notes (Signed)
Patient given discharge instruction, verbalized understand. Patient ambulatory out of the department with daughter
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
## 2012-09-29 NOTE — ED Provider Notes (Signed)
Medical screening examination/treatment/procedure(s) were performed by non-physician practitioner and as supervising physician I was immediately available for consultation/collaboration.  Ollis Daudelin, MD 09/29/12 0728 

## 2012-12-19 ENCOUNTER — Emergency Department (HOSPITAL_COMMUNITY): Payer: Medicaid Other

## 2012-12-19 ENCOUNTER — Encounter (HOSPITAL_COMMUNITY): Payer: Self-pay | Admitting: Emergency Medicine

## 2012-12-19 ENCOUNTER — Emergency Department (HOSPITAL_COMMUNITY)
Admission: EM | Admit: 2012-12-19 | Discharge: 2012-12-19 | Disposition: A | Discharge status: Home / Self Care | Payer: Medicaid Other | Attending: Emergency Medicine | Admitting: Emergency Medicine

## 2012-12-19 DIAGNOSIS — F17200 Nicotine dependence, unspecified, uncomplicated: Secondary | ICD-10-CM | POA: Insufficient documentation

## 2012-12-19 DIAGNOSIS — Z79899 Other long term (current) drug therapy: Secondary | ICD-10-CM | POA: Insufficient documentation

## 2012-12-19 DIAGNOSIS — Z8619 Personal history of other infectious and parasitic diseases: Secondary | ICD-10-CM | POA: Insufficient documentation

## 2012-12-19 DIAGNOSIS — J4489 Other specified chronic obstructive pulmonary disease: Secondary | ICD-10-CM | POA: Insufficient documentation

## 2012-12-19 DIAGNOSIS — I10 Essential (primary) hypertension: Secondary | ICD-10-CM | POA: Insufficient documentation

## 2012-12-19 DIAGNOSIS — Z859 Personal history of malignant neoplasm, unspecified: Secondary | ICD-10-CM | POA: Insufficient documentation

## 2012-12-19 DIAGNOSIS — E119 Type 2 diabetes mellitus without complications: Secondary | ICD-10-CM | POA: Insufficient documentation

## 2012-12-19 DIAGNOSIS — G8929 Other chronic pain: Secondary | ICD-10-CM | POA: Insufficient documentation

## 2012-12-19 DIAGNOSIS — S59909A Unspecified injury of unspecified elbow, initial encounter: Secondary | ICD-10-CM | POA: Insufficient documentation

## 2012-12-19 DIAGNOSIS — S4980XA Other specified injuries of shoulder and upper arm, unspecified arm, initial encounter: Secondary | ICD-10-CM | POA: Insufficient documentation

## 2012-12-19 DIAGNOSIS — J449 Chronic obstructive pulmonary disease, unspecified: Secondary | ICD-10-CM | POA: Insufficient documentation

## 2012-12-19 DIAGNOSIS — Z880 Allergy status to penicillin: Secondary | ICD-10-CM | POA: Insufficient documentation

## 2012-12-19 DIAGNOSIS — M25531 Pain in right wrist: Secondary | ICD-10-CM

## 2012-12-19 DIAGNOSIS — S46909A Unspecified injury of unspecified muscle, fascia and tendon at shoulder and upper arm level, unspecified arm, initial encounter: Secondary | ICD-10-CM | POA: Insufficient documentation

## 2012-12-19 DIAGNOSIS — Z8614 Personal history of Methicillin resistant Staphylococcus aureus infection: Secondary | ICD-10-CM | POA: Insufficient documentation

## 2012-12-19 DIAGNOSIS — IMO0002 Reserved for concepts with insufficient information to code with codable children: Secondary | ICD-10-CM | POA: Insufficient documentation

## 2012-12-19 DIAGNOSIS — S6990XA Unspecified injury of unspecified wrist, hand and finger(s), initial encounter: Secondary | ICD-10-CM | POA: Insufficient documentation

## 2012-12-19 DIAGNOSIS — Z8701 Personal history of pneumonia (recurrent): Secondary | ICD-10-CM | POA: Insufficient documentation

## 2012-12-19 HISTORY — DX: Carrier or suspected carrier of methicillin resistant Staphylococcus aureus: Z22.322

## 2012-12-19 HISTORY — DX: Reserved for concepts with insufficient information to code with codable children: IMO0002

## 2012-12-19 MED ORDER — PREDNISONE 20 MG PO TABS: 60.0000 mg | ORAL_TABLET | Freq: Every day | ORAL | Status: DC

## 2012-12-19 MED ORDER — DEXAMETHASONE SODIUM PHOSPHATE 4 MG/ML IJ SOLN
10.0000 mg | Freq: Once | INTRAMUSCULAR | Status: AC
  Administered 2012-12-19: 10 mg via INTRAVENOUS
  Filled 2012-12-19: qty 3

## 2012-12-19 NOTE — ED Notes (Signed)
Called name no answer 
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
## 2012-12-22 NOTE — ED Provider Notes (Signed)
Medical screening examination/treatment/procedure(s) were performed by non-physician practitioner and as supervising physician I was immediately available for consultation/collaboration.  EKG Interpretation   None         Bernarda Erck L Tyus Kallam, MD 12/22/12 2316 
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
## 2012-12-23 ENCOUNTER — Telehealth: Payer: Self-pay | Admitting: Orthopedic Surgery

## 2012-12-23 NOTE — Telephone Encounter (Signed)
Patient called 12/23/12 to request an appointment for a follow up from [facility] for shoulder pain. She has [employer], and I told her she will need to get an authorization from the PCP listed on her Medicaid card.  Once we get the referral from PCP will call her to look at appointment dates

## 2013-10-04 DIAGNOSIS — D6851 Activated protein C resistance: Secondary | ICD-10-CM | POA: Insufficient documentation

## 2014-11-04 ENCOUNTER — Emergency Department (HOSPITAL_COMMUNITY)
Admission: EM | Admit: 2014-11-04 | Discharge: 2014-11-04 | Disposition: A | Discharge status: Home / Self Care | Payer: Medicaid Other | Attending: Emergency Medicine | Admitting: Emergency Medicine

## 2014-11-04 ENCOUNTER — Emergency Department (HOSPITAL_COMMUNITY): Payer: Medicaid Other

## 2014-11-04 ENCOUNTER — Encounter (HOSPITAL_COMMUNITY): Payer: Self-pay | Admitting: *Deleted

## 2014-11-04 DIAGNOSIS — Z720 Tobacco use: Secondary | ICD-10-CM | POA: Insufficient documentation

## 2014-11-04 DIAGNOSIS — E119 Type 2 diabetes mellitus without complications: Secondary | ICD-10-CM | POA: Diagnosis not present

## 2014-11-04 DIAGNOSIS — J449 Chronic obstructive pulmonary disease, unspecified: Secondary | ICD-10-CM | POA: Diagnosis not present

## 2014-11-04 DIAGNOSIS — Z8701 Personal history of pneumonia (recurrent): Secondary | ICD-10-CM | POA: Diagnosis not present

## 2014-11-04 DIAGNOSIS — K219 Gastro-esophageal reflux disease without esophagitis: Secondary | ICD-10-CM | POA: Insufficient documentation

## 2014-11-04 DIAGNOSIS — G8929 Other chronic pain: Secondary | ICD-10-CM | POA: Insufficient documentation

## 2014-11-04 DIAGNOSIS — Z880 Allergy status to penicillin: Secondary | ICD-10-CM | POA: Insufficient documentation

## 2014-11-04 DIAGNOSIS — M5136 Other intervertebral disc degeneration, lumbar region: Secondary | ICD-10-CM | POA: Diagnosis not present

## 2014-11-04 DIAGNOSIS — Z79899 Other long term (current) drug therapy: Secondary | ICD-10-CM | POA: Diagnosis not present

## 2014-11-04 DIAGNOSIS — Z7952 Long term (current) use of systemic steroids: Secondary | ICD-10-CM | POA: Diagnosis not present

## 2014-11-04 DIAGNOSIS — R0989 Other specified symptoms and signs involving the circulatory and respiratory systems: Secondary | ICD-10-CM | POA: Insufficient documentation

## 2014-11-04 DIAGNOSIS — Z9889 Other specified postprocedural states: Secondary | ICD-10-CM | POA: Diagnosis not present

## 2014-11-04 DIAGNOSIS — Z792 Long term (current) use of antibiotics: Secondary | ICD-10-CM | POA: Insufficient documentation

## 2014-11-04 DIAGNOSIS — R05 Cough: Secondary | ICD-10-CM | POA: Diagnosis not present

## 2014-11-04 DIAGNOSIS — M545 Low back pain: Secondary | ICD-10-CM | POA: Diagnosis present

## 2014-11-04 DIAGNOSIS — Z8614 Personal history of Methicillin resistant Staphylococcus aureus infection: Secondary | ICD-10-CM | POA: Insufficient documentation

## 2014-11-04 DIAGNOSIS — Z8619 Personal history of other infectious and parasitic diseases: Secondary | ICD-10-CM | POA: Diagnosis not present

## 2014-11-04 DIAGNOSIS — Z859 Personal history of malignant neoplasm, unspecified: Secondary | ICD-10-CM | POA: Diagnosis not present

## 2014-11-04 DIAGNOSIS — I10 Essential (primary) hypertension: Secondary | ICD-10-CM | POA: Diagnosis not present

## 2014-11-04 MED ORDER — PROCHLORPERAZINE MALEATE 5 MG PO TABS
10.0000 mg | ORAL_TABLET | Freq: Four times a day (QID) | ORAL | Status: DC | PRN
  Administered 2014-11-04: 10 mg via ORAL
  Filled 2014-11-04: qty 2

## 2014-11-04 MED ORDER — FAMOTIDINE 20 MG PO TABS
20.0000 mg | ORAL_TABLET | Freq: Once | ORAL | Status: AC
  Administered 2014-11-04: 20 mg via ORAL
  Filled 2014-11-04: qty 1

## 2014-11-04 MED ORDER — PANTOPRAZOLE SODIUM 40 MG PO TBEC
40.0000 mg | DELAYED_RELEASE_TABLET | Freq: Once | ORAL | Status: AC
  Administered 2014-11-04: 40 mg via ORAL
  Filled 2014-11-04: qty 1

## 2014-11-04 NOTE — ED Provider Notes (Signed)
CSN: [id number]     Arrival date & time 11/04/14  1756 History   First MD Initiated Contact with Patient 11/04/14 1811     Chief Complaint  Patient presents with  . Back Pain     (Consider location/radiation/quality/duration/timing/severity/associated sxs/prior Treatment) Patient is a 49 y.o. female presenting with back pain. The history is provided by the patient.  Back Pain Location:  Lumbar spine Quality:  Stiffness, cramping and shooting Pain severity:  Moderate Pain is:  Same all the time Onset quality:  Gradual Timing:  Intermittent Progression:  Worsening Chronicity: acute on chronic. Context comment:  Hx of back surg for ddd. Relieved by:  Nothing Worsened by:  Movement Associated symptoms: no bladder incontinence, no bowel incontinence and no perianal numbness     Past Medical History  Diagnosis Date  . Asthma   . COPD (chronic obstructive pulmonary disease) ([place])   . Hep C w/o coma, chronic ([place])   . Cancer ([place])   . Hypertension   . Diabetes mellitus   . Pneumonia   . 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 DDD (degenerative disc disease)    Past Surgical History  Procedure Laterality Date  . Abdominal hysterectomy    . Back surgery     Family History  Problem Relation Age of Onset  . Diabetes Mother   . Cancer Mother   . Cancer Other    Social History  Substance Use Topics  . Smoking status: Current Every Day Smoker -- 0.50 packs/day for 37 years    Types: Cigarettes  . Smokeless tobacco: Never Used  . Alcohol Use: No   OB History    No data available     Review of Systems  Respiratory: Positive for cough.   Gastrointestinal: Negative for bowel incontinence.  Genitourinary: Negative for bladder incontinence.  Musculoskeletal: Positive for back pain.  All other systems reviewed and are negative.     Allergies  Cephalexin; Abilify; Augmentin; Penicillins; Seroquel; Aspirin; and Sulfa antibiotics  Home  Medications   Prior to Admission medications   Medication Sig Start Date End Date Taking? Authorizing Provider  albuterol (PROVENTIL HFA;VENTOLIN HFA) 108 (90 BASE) MCG/ACT inhaler Inhale 2 puffs into the lungs every 6 (six) hours as needed. For shortness of breath     Historical Provider, MD  alprazolam Duanne Moron) 2 MG tablet Take 2 mg by mouth 3 (three) times daily.      Historical Provider, MD  antiseptic oral rinse (BIOTENE) LIQD 15 mLs by Mouth Rinse route as needed (for dry mouth relief).    Historical Provider, MD  docusate sodium (STOOL SOFTENER) 100 MG capsule Take 100 mg by mouth daily.    Historical Provider, MD  DULoxetine (CYMBALTA) 60 MG capsule Take 60 mg by mouth daily.    Historical Provider, MD  Fluticasone-Salmeterol (ADVAIR DISKUS) 250-50 MCG/DOSE AEPB Inhale 1 puff into the lungs every 12 (twelve) hours.      Historical Provider, MD  gabapentin (NEURONTIN) 600 MG tablet Take 1,200 mg by mouth 2 (two) times daily.      Historical Provider, MD  hydrochlorothiazide (HYDRODIURIL) 25 MG tablet Take 25 mg by mouth daily.      Historical Provider, MD  levalbuterol Penne Lash) 1.25 MG/3ML nebulizer solution Take 1.25 mg by nebulization every 4 (four) hours as needed for wheezing or shortness of breath.    Historical Provider, MD  loratadine (CLARITIN) 10 MG tablet Take 10 mg by mouth daily.      Historical Provider,  MD  mupirocin ointment (BACTROBAN) 2 % Apply 1 application topically 3 (three) times daily.    Historical Provider, MD  oxycodone (ROXICODONE) 30 MG immediate release tablet Take 30 mg by mouth every 4 (four) hours as needed for pain (May take every 4 to 6 hours as needed for pain).    Historical Provider, MD  potassium chloride (K-DUR) 10 MEQ tablet Take 20 mEq by mouth daily.    Historical Provider, MD  predniSONE (DELTASONE) 20 MG tablet Take 3 tablets (60 mg total) by mouth daily. 12/19/12   Evalee Jefferson, PA-C   BP 139/92 mmHg  Pulse 95  Temp(Src) 97.7 F (36.5 C) (Oral)   Resp 16  Ht 5\' 6"  (1.676 m)  Wt 148 lb (67.132 kg)  BMI 23.90 kg/m2  SpO2 99% Physical Exam  Constitutional: She is oriented to person, place, and time. She appears well-developed and well-nourished.  Non-toxic appearance.  HENT:  Head: Normocephalic.  Right Ear: Tympanic membrane and external ear normal.  Left Ear: Tympanic membrane and external ear normal.  Eyes: EOM and lids are normal. Pupils are equal, round, and reactive to light.  Neck: Normal range of motion. Neck supple. Carotid bruit is not present.  Cardiovascular: Normal rate, regular rhythm, normal heart sounds, intact distal pulses and normal pulses.   Pulmonary/Chest: Effort normal. No tachypnea. No respiratory distress. She has rhonchi.  Course breath sounds present with occasional scattered rhonchi.  Abdominal: Soft. Bowel sounds are normal. There is no tenderness. There is no guarding.  Musculoskeletal: She exhibits tenderness.       Lumbar back: She exhibits decreased range of motion, pain and spasm. She exhibits no deformity.  Lymphadenopathy:       Head (right side): No submandibular adenopathy present.       Head (left side): No submandibular adenopathy present.    She has no cervical adenopathy.  Neurological: She is alert and oriented to person, place, and time. She has normal strength. No cranial nerve deficit or sensory deficit.  Patient ambulatory without problem. No foot drop noted.  Skin: Skin is warm and dry.  Psychiatric: She has a normal mood and affect. Her speech is normal.  Nursing note and vitals reviewed.   ED Course  Procedures (including critical care time) Labs Review Labs Reviewed - No data to display  Imaging Review No results found. I have personally reviewed and evaluated these images and lab results as part of my medical decision-making.   EKG Interpretation None      MDM Chest x-ray shows no radiographic evidence of acute cardiopulmonary disease. Lumbar spine shows no  evidence of acute fracture or subluxation. 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 Patient is to follow-up with primary physician Dr. Ronnald Ramp for assistance in management of this issue.  Patient states that she was supposed to get medicine for heartburn and reflux disease 2 days ago, but her physician forgot to do it. Patient given Pepcid, and Protonix and Compazine. The patient has medications for her back, and will follow with her primary physician and specialist concerning this.    Final diagnoses:  None    *I have reviewed nursing notes, vital signs, and all appropriate lab and imaging results for this patient.Lily Kocher, PA-C 11/06/14 2135  Daleen Bo, MD 11/08/14 [phone]

## 2014-11-04 NOTE — ED Notes (Signed)
Dismiss last primary assessment and vital signs. Error in charting.

## 2014-11-04 NOTE — Discharge Instructions (Signed)
Your chest x-ray is negative for pneumonia, collapse lung, or other acute event. The x-ray of your lumbar spine show severe degenerative disc disease, and advancing arthritis in your lower back. No fracture or dislocation appreciated. Please discuss these findings with your primary physician. Degenerative Disk Disease Degenerative disk disease is a condition caused by the changes that occur in spinal disks as you grow older. Spinal disks are soft and compressible disks located between the bones of your spine (vertebrae). These disks act like shock absorbers. Degenerative disk disease can affect the whole spine. However, the neck and lower back are most commonly affected. Many changes can occur in the spinal disks with aging, such as:  The spinal disks may dry and shrink.  Small tears may occur in the tough, outer covering of the disk (annulus).  The disk space may become smaller due to loss of water.  Abnormal growths in the bone (spurs) may occur. This can put pressure on the nerve roots exiting the spinal canal, causing pain.  The spinal canal may become narrowed. RISK FACTORS   Being overweight.  Having a family history of degenerative disk disease.  Smoking.  There is increased risk if you are doing heavy lifting or have a sudden injury. SIGNS AND SYMPTOMS  Symptoms vary from person to person and may include:  Pain that varies in intensity. Some people have no pain, while others have severe pain. The location of the pain depends on the part of your backbone that is affected.  You will have neck or arm pain if a disk in the neck area is affected.  You will have pain in your back, buttocks, or legs if a disk in the lower back is affected.  Pain that becomes worse while bending, reaching up, or with twisting movements.  Pain that may start gradually and then get worse as time passes. It may also start after a major or minor injury.  Numbness or tingling in the arms or  legs. DIAGNOSIS  Your health care provider will ask you about your symptoms and about activities or habits that may cause the pain. He or she may also ask about any injuries, diseases, or treatments you have had. Your health care provider will examine you to check for the range of movement that is possible in the affected area, to check for strength in your extremities, and to check for sensation in the areas of the arms and legs supplied by different nerve roots. You may also have:   An X-ray of the spine.  Other imaging tests, such as MRI. TREATMENT  Your health care provider will advise you on the best plan for treatment. Treatment may include:  Medicines.  Rehabilitation exercises. HOME CARE INSTRUCTIONS   Follow proper lifting and walking techniques as advised by your health care provider.  Maintain good posture.  Exercise regularly as advised by your health care provider.  Perform relaxation exercises.  Change your sitting, standing, and sleeping habits as advised by your health care provider.  Change positions frequently.  Lose weight or maintain a healthy weight as advised by your health care provider.  Do not use any tobacco products, including cigarettes, chewing tobacco, or electronic cigarettes. If you need help quitting, ask your health care provider.  Wear supportive footwear.  Take medicines only as directed by your health care provider. SEEK MEDICAL CARE IF:   Your pain does not go away within 1-4 weeks.  You have significant appetite or weight loss. [place]  CARE IF:   Your pain is severe.  You notice weakness in your arms, hands, or legs.  You begin to lose control of your bladder or bowel movements.  You have fevers or night sweats. MAKE SURE YOU:   Understand these instructions.  Will watch your condition.  Will get help right away if you are not doing well or get worse.   This information is not intended to replace advice  given to you by your health care provider. Make sure you discuss any questions you have with your health care provider.   Document Released: 10/20/2006 Document Revised: 01/13/2014 Document Reviewed: 04/26/2013 Elsevier Interactive Patient Education 2016 [place].  Gastroesophageal Reflux Disease, Adult Normally, food travels down the esophagus and stays in the stomach to be digested. If a person has gastroesophageal reflux disease (GERD), food and stomach acid move back up into the esophagus. When this happens, the esophagus becomes sore and swollen (inflamed). Over time, GERD can make small holes (ulcers) in the lining of the esophagus. HOME CARE Diet  Follow a diet as told by your doctor. You may need to avoid foods and drinks such as:  Coffee and tea (with or without caffeine).  Drinks that contain alcohol.  Energy drinks and sports drinks.  Carbonated drinks or sodas.  Chocolate and cocoa.  Peppermint and mint flavorings.  Garlic and onions.  Horseradish.  Spicy and acidic foods, such as peppers, chili powder, curry powder, vinegar, hot sauces, and BBQ sauce.  Citrus fruit juices and citrus fruits, such as oranges, lemons, and limes.  Tomato-based foods, such as red sauce, chili, salsa, and pizza with red sauce.  Fried and fatty foods, such as donuts, french fries, potato chips, and high-fat dressings.  High-fat meats, such as hot dogs, rib eye steak, sausage, ham, and bacon.  High-fat dairy items, such as whole milk, butter, and cream cheese.  Eat small meals often. Avoid eating large meals.  Avoid drinking large amounts of liquid with your meals.  Avoid eating meals during the 2-3 hours before bedtime.  Avoid lying down right after you eat.  Do not exercise right after you eat. General Instructions  Pay attention to any changes in your symptoms.  Take over-the-counter and prescription medicines only as told by your doctor. Do not take aspirin,  ibuprofen, or other NSAIDs unless your doctor says it is okay.  Do not use any tobacco products, including cigarettes, chewing tobacco, and e-cigarettes. If you need help quitting, ask your doctor.  Wear loose clothes. Do not wear anything tight around your waist.  Raise (elevate) the head of your bed about 6 inches (15 cm).  Try to lower your stress. If you need help doing this, ask your doctor.  If you are overweight, lose an amount of weight that is healthy for you. Ask your doctor about a safe weight loss goal.  Keep all follow-up visits as told by your doctor. This is important. GET HELP IF:  You have new symptoms.  You lose weight and you do not know why it is happening.  You have trouble swallowing, or it hurts to swallow.  You have wheezing or a cough that keeps happening.  Your symptoms do not get better with treatment.  You have a hoarse voice. GET HELP RIGHT AWAY IF:  You have pain in your arms, neck, jaw, teeth, or back.  You feel sweaty, dizzy, or light-headed.  You have chest pain or shortness of breath.  You throw up (vomit) and  your throw up looks like blood or coffee grounds.  You pass out (faint).  Your poop (stool) is bloody or black.  You cannot swallow, drink, or eat.   This information is not intended to replace advice given to you by your health care provider. Make sure you discuss any questions you have with your health care provider.   Document Released: 06/11/2007 Document Revised: 09/13/2014 Document Reviewed: 04/19/2014 Elsevier Interactive Patient Education [employer].

## 2014-11-04 NOTE — ED Notes (Signed)
Pt comes in with lower back pain. Pt has hx of surgery and has constant pain her pain has worsened. Pt verbalizes that she doesn't want pain medication but wants to know why her back hurts. Pt denies injury but states she "strained" her back yesterday.

## 2015-05-22 ENCOUNTER — Emergency Department (HOSPITAL_COMMUNITY)
Admission: EM | Admit: 2015-05-22 | Discharge: 2015-05-22 | Disposition: A | Discharge status: Home / Self Care | Payer: Medicaid Other | Attending: Emergency Medicine | Admitting: Emergency Medicine

## 2015-05-22 ENCOUNTER — Emergency Department (HOSPITAL_COMMUNITY): Payer: Medicaid Other

## 2015-05-22 ENCOUNTER — Encounter (HOSPITAL_COMMUNITY): Payer: Self-pay | Admitting: *Deleted

## 2015-05-22 DIAGNOSIS — R05 Cough: Secondary | ICD-10-CM | POA: Diagnosis present

## 2015-05-22 DIAGNOSIS — J42 Unspecified chronic bronchitis: Secondary | ICD-10-CM | POA: Diagnosis not present

## 2015-05-22 DIAGNOSIS — R0789 Other chest pain: Secondary | ICD-10-CM | POA: Insufficient documentation

## 2015-05-22 DIAGNOSIS — I10 Essential (primary) hypertension: Secondary | ICD-10-CM | POA: Insufficient documentation

## 2015-05-22 DIAGNOSIS — F17210 Nicotine dependence, cigarettes, uncomplicated: Secondary | ICD-10-CM | POA: Insufficient documentation

## 2015-05-22 DIAGNOSIS — M791 Myalgia: Secondary | ICD-10-CM | POA: Insufficient documentation

## 2015-05-22 DIAGNOSIS — E119 Type 2 diabetes mellitus without complications: Secondary | ICD-10-CM | POA: Insufficient documentation

## 2015-05-22 DIAGNOSIS — J45909 Unspecified asthma, uncomplicated: Secondary | ICD-10-CM | POA: Insufficient documentation

## 2015-05-22 DIAGNOSIS — Z79899 Other long term (current) drug therapy: Secondary | ICD-10-CM | POA: Insufficient documentation

## 2015-05-22 HISTORY — DX: Activated protein C resistance: D68.51

## 2015-05-22 MED ORDER — OXYCODONE-ACETAMINOPHEN 5-325 MG PO TABS
1.0000 | ORAL_TABLET | Freq: Once | ORAL | Status: AC
  Administered 2015-05-22: 1 via ORAL
  Filled 2015-05-22: qty 1

## 2015-05-22 MED ORDER — IPRATROPIUM-ALBUTEROL 0.5-2.5 (3) MG/3ML IN SOLN
3.0000 mL | Freq: Once | RESPIRATORY_TRACT | Status: AC
  Administered 2015-05-22: 3 mL via RESPIRATORY_TRACT
  Filled 2015-05-22: qty 3

## 2015-05-22 NOTE — ED Provider Notes (Signed)
CSN: [id number]     Arrival date & time 05/22/15  2019 History   First MD Initiated Contact with Patient 05/22/15 2225     Chief Complaint  Patient presents with  . Muscle Pain     (Consider location/radiation/quality/duration/timing/severity/associated sxs/prior Treatment) Patient is a 50 y.o. female presenting with musculoskeletal pain. The history is provided by the patient.  Muscle Pain This is a new problem. The current episode started in the past 7 days. The problem occurs constantly. The problem has been gradually worsening. Associated symptoms include congestion, coughing and myalgias. The symptoms are aggravated by coughing. She has tried oral narcotics for the symptoms.   [name] is a 50 y.o. female who presents to the ED with left breast tenderness, cough, congestion and sore throat. Patient with multiple medical problems as listed below. She reports she also has a hx of drug abuse and was dismissed from her PCP due to positive drug screen for Cocaine. Her PCP had been giving her Percocet to control her DDD pain.  Patient reports that tonight she took one of her mother's hydrocodone for pain and it did help. Patient is an every day smoker.   Past Medical History  Diagnosis Date  . Asthma   . COPD (chronic obstructive pulmonary disease) ([place])   . Hep C w/o coma, chronic ([place])   . Cancer ([place])   . Hypertension   . Diabetes mellitus   . Pneumonia   . 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 DDD (degenerative disc disease)   . Factor 5 Leiden mutation, heterozygous [facility])    Past Surgical History  Procedure Laterality Date  . Abdominal hysterectomy    . Back surgery     Family History  Problem Relation Age of Onset  . Diabetes Mother   . Cancer Mother   . Cancer Other    Social History  Substance Use Topics  . Smoking status: Current Every Day Smoker -- 0.50 packs/day for 37 years    Types: Cigarettes  . Smokeless tobacco: Never Used   . Alcohol Use: No   OB History    No data available     Review of Systems  HENT: Positive for congestion.   Respiratory: Positive for cough.   Musculoskeletal: Positive for myalgias.      Allergies  Cephalexin; Abilify; Augmentin; Penicillins; Seroquel; Aspirin; and Sulfa antibiotics  Home Medications   Prior to Admission medications   Medication Sig Start Date End Date Taking? Authorizing Provider  albuterol (PROVENTIL HFA;VENTOLIN HFA) 108 (90 BASE) MCG/ACT inhaler Inhale 2 puffs into the lungs every 6 (six) hours as needed. For shortness of breath     Historical Provider, MD  alprazolam Duanne Moron) 2 MG tablet Take 2 mg by mouth 3 (three) times daily.      Historical Provider, MD  antiseptic oral rinse (BIOTENE) LIQD 15 mLs by Mouth Rinse route as needed (for dry mouth relief).    Historical Provider, MD  DULoxetine (CYMBALTA) 60 MG capsule Take 60 mg by mouth 2 (two) times daily.     Historical Provider, MD  esomeprazole (NEXIUM) 20 MG capsule Take 20 mg by mouth daily at 12 noon.    Historical Provider, MD  Fluticasone-Salmeterol (ADVAIR DISKUS) 250-50 MCG/DOSE AEPB Inhale 1 puff into the lungs every 12 (twelve) hours.      Historical Provider, MD  gabapentin (NEURONTIN) 600 MG tablet Take 1,200 mg by mouth 2 (two) times daily.      Historical Provider,  MD  hydrochlorothiazide (HYDRODIURIL) 25 MG tablet Take 25 mg by mouth daily.      Historical Provider, MD  levalbuterol Penne Lash) 1.25 MG/3ML nebulizer solution Take 1.25 mg by nebulization every 4 (four) hours as needed for wheezing or shortness of breath.    Historical Provider, MD  loratadine (CLARITIN) 10 MG tablet Take 10 mg by mouth daily.      Historical Provider, MD  mupirocin ointment (BACTROBAN) 2 % Apply 1 application topically 3 (three) times daily.    Historical Provider, MD  oxycodone (ROXICODONE) 30 MG immediate release tablet Take 60 mg by mouth every 4 (four) hours as needed for pain (May take every 4 to 6 hours as  needed for pain).     Historical Provider, MD  potassium chloride (K-DUR) 10 MEQ tablet Take 20 mEq by mouth daily.    Historical Provider, MD  Umeclidinium Bromide (INCRUSE ELLIPTA) 62.5 MCG/INH AEPB Inhale 1 puff into the lungs daily.    Historical Provider, MD   BP 140/91 mmHg  Pulse 103  Temp(Src) 98.2 F (36.8 C) (Oral)  Resp 20  Ht 5\' 1"  (1.549 m)  Wt 73.483 kg  BMI 30.63 kg/m2  SpO2 97% Physical Exam  Constitutional: She is oriented to person, place, and time. She appears well-developed and well-nourished. No distress.  HENT:  Head: Normocephalic and atraumatic.  Right Ear: Tympanic membrane normal.  Left Ear: Tympanic membrane normal.  Nose: Nose normal.  Mouth/Throat: Uvula is midline, oropharynx is clear and moist and mucous membranes are normal.  Eyes: Conjunctivae and EOM are normal.  Neck: Neck supple.  Cardiovascular: Regular rhythm.  Tachycardia present.   Pulmonary/Chest: Effort normal. No respiratory distress. She has wheezes. She exhibits tenderness. She exhibits no crepitus.    Abdominal: Soft. Bowel sounds are normal.  Musculoskeletal: Normal range of motion.  Neurological: She is alert and oriented to person, place, and time. No cranial nerve deficit.  Skin: Skin is warm and dry.  Psychiatric: She has a normal mood and affect. Her behavior is normal.  Nursing note and vitals reviewed.   ED Course  Procedures (including critical care time) DuoNeb Lungs much improved after duoneb  Dr. Roderic Palau in to examine the patient.  Labs Review Labs Reviewed - No data to display  Imaging Review Dg Ribs Unilateral W/chest Left  05/22/2015  CLINICAL DATA:  Chest and LEFT rib pain for 1 week. No reported injury. EXAM: LEFT RIBS AND CHEST - 3+ VIEW COMPARISON:  11/04/2014. FINDINGS: Normal cardiomediastinal silhouette. Clear lung fields. No effusion or pneumothorax. No visible rib fracture. Similar appearance to priors. IMPRESSION: Negative. Electronically Signed   By:  Staci Righter M.D.   On: 05/22/2015 21:10    MDM  50 y.o. female with chronic bronchitis and muscle tenderness from coughing so hard. She is an every day smoker. Stable for d/c without fever and normal CXR. She has inhalers and medication for her bronchitis at home. Patient given referral to [facility] since her PCP dismissed her from her practice. Discussed with the patient and all questioned fully answered. She will return if any problems arise.  Final diagnoses:  Chest wall pain  Chronic bronchitis, unspecified chronic bronchitis type [facility])      Ashley Murrain, NP 05/24/15 2049  Milton Ferguson, MD 05/28/15 [phone]

## 2015-05-22 NOTE — ED Notes (Signed)
Pt c/o pain to left breast and states it hurts to take a deep breath; pt states she has been running a fever and states she is coughing up green sputum

## 2015-05-22 NOTE — Discharge Instructions (Signed)
Use your inhaler and home medication as directed. Follow up with Dr. Ronnald Ramp or with the [facility].   Chest Wall Pain Chest wall pain is pain in or around the bones and muscles of your chest. Sometimes, an injury causes this pain. Sometimes, the cause may not be known. This pain may take several weeks or longer to get better. HOME CARE INSTRUCTIONS  Pay attention to any changes in your symptoms. Take these actions to help with your pain:   Rest as told by your health care provider.   Avoid activities that cause pain. These include any activities that use your chest muscles or your abdominal and side muscles to lift heavy items.   If directed, apply ice to the painful area:  Put ice in a plastic bag.  Place a towel between your skin and the bag.  Leave the ice on for 20 minutes, 2-3 times per day.  Take over-the-counter and prescription medicines only as told by your health care provider.  Do not use tobacco products, including cigarettes, chewing tobacco, and e-cigarettes. If you need help quitting, ask your health care provider.  Keep all follow-up visits as told by your health care provider. This is important. SEEK MEDICAL CARE IF:  You have a fever.  Your chest pain becomes worse.  You have new symptoms. SEEK IMMEDIATE MEDICAL CARE IF:  You have nausea or vomiting.  You feel sweaty or light-headed.  You have a cough with phlegm (sputum) or you cough up blood.  You develop shortness of breath.   This information is not intended to replace advice given to you by your health care provider. Make sure you discuss any questions you have with your health care provider.   Document Released: 12/23/2004 Document Revised: 09/13/2014 Document Reviewed: 03/20/2014 Elsevier Interactive Patient Education 2016 Elsevier Inc.  Chronic Bronchitis Chronic bronchitis is a lasting inflammation of the bronchial tubes, which are the tubes that carry air into your lungs.  This is inflammation that occurs:   On most days of the week.   For at least three months at a time.   Over a period of two years in a row. When the bronchial tubes are inflamed, they start to produce mucus. The inflammation and buildup of mucus make it more difficult to breathe. Chronic bronchitis is usually a permanent problem and is one type of chronic obstructive pulmonary disease (COPD). People with chronic bronchitis are at greater risk for getting repeated colds, or respiratory infections. CAUSES  Chronic bronchitis most often occurs in people who have:  Long-standing, severe asthma.  A history of smoking.  Asthma and who also smoke. SIGNS AND SYMPTOMS  Chronic bronchitis may cause the following:   A cough that brings up mucus (productive cough).  Shortness of breath.  Early morning headache.  Wheezing.  Chest discomfort.   Recurring respiratory infections. DIAGNOSIS  Your health care provider may confirm the diagnosis by:  Taking your medical history.  Performing a physical exam.  Taking a chest X-ray.   Performing pulmonary function tests. TREATMENT  Treatment involves controlling symptoms with medicines, oxygen therapy, or making lifestyle changes, such as exercising and eating a healthy, well-balanced diet. Medicines could include:  Inhalers to improve air flow in and out of your lungs.  Antibiotics to treat bacterial infections, such as pneumonia, sinus infections, and acute bronchitis. As a preventative measure, your health care provider may recommend routine vaccinations for influenza and pneumonia. This is to prevent infection and hospitalization since  you may be more at risk for these types of infections.  HOME CARE INSTRUCTIONS  Take medicines only as directed by your health care provider.   If you smoke cigarettes, chew tobacco, or use electronic cigarettes, quit. If you need help quitting, ask your health care provider.  Avoid pollen,  dust, animal dander, molds, smoke, and other things that cause shortness of breath or wheezing attacks.  Talk to your health care provider about possible exercise routines. Regular exercise is very important to help you feel better.  If you are prescribed oxygen use at home follow these guidelines:  Never smoke while using oxygen. Oxygen does not burn or explode, but flammable materials will burn faster in the presence of oxygen.  Keep a Data processing manager close by. Let your fire department know that you have oxygen in your home.  Warn visitors not to smoke near you when you are using oxygen. Put up "no smoking" signs in your home where you most often use the oxygen.  Regularly test your smoke detectors at home to make sure they work. If you receive care in your home from a nurse or other health care provider, he or she may also check to make sure your smoke detectors work.  Ask your health care provider whether you would benefit from a pulmonary rehabilitation program.  Do not wait to get medical care if you have any concerning symptoms. Delays could cause permanent injury and may be life threatening. SEEK MEDICAL CARE IF:  You have increased coughing or shortness of breath or both.  You have muscle aches.  You have chest pain.  Your mucus gets thicker.  Your mucus changes from clear or white to yellow, green, gray, or bloody. SEEK IMMEDIATE MEDICAL CARE IF:  Your usual medicines do not stop your wheezing.   You have increased difficulty breathing.   You have any problems with the medicine you are taking, such as a rash, itching, swelling, or trouble breathing. MAKE SURE YOU:   Understand these instructions.  Will watch your condition.  Will get help right away if you are not doing well or get worse.   This information is not intended to replace advice given to you by your health care provider. Make sure you discuss any questions you have with your health care provider.    Document Released: 10/10/2005 Document Revised: 01/13/2014 Document Reviewed: 01/31/2013 Elsevier Interactive Patient Education [employer].

## 2015-06-14 ENCOUNTER — Encounter (HOSPITAL_COMMUNITY): Payer: Self-pay

## 2015-06-14 ENCOUNTER — Emergency Department (HOSPITAL_COMMUNITY)
Admission: EM | Admit: 2015-06-14 | Discharge: 2015-06-14 | Disposition: A | Discharge status: Home / Self Care | Payer: Medicaid Other | Attending: Emergency Medicine | Admitting: Emergency Medicine

## 2015-06-14 DIAGNOSIS — I10 Essential (primary) hypertension: Secondary | ICD-10-CM | POA: Insufficient documentation

## 2015-06-14 DIAGNOSIS — E119 Type 2 diabetes mellitus without complications: Secondary | ICD-10-CM | POA: Diagnosis not present

## 2015-06-14 DIAGNOSIS — F17210 Nicotine dependence, cigarettes, uncomplicated: Secondary | ICD-10-CM | POA: Diagnosis not present

## 2015-06-14 DIAGNOSIS — J45909 Unspecified asthma, uncomplicated: Secondary | ICD-10-CM | POA: Insufficient documentation

## 2015-06-14 DIAGNOSIS — Z760 Encounter for issue of repeat prescription: Secondary | ICD-10-CM | POA: Diagnosis present

## 2015-06-14 DIAGNOSIS — J449 Chronic obstructive pulmonary disease, unspecified: Secondary | ICD-10-CM | POA: Diagnosis not present

## 2015-06-14 MED ORDER — PROMETHAZINE HCL 25 MG RE SUPP
25.0000 mg | Freq: Four times a day (QID) | RECTAL | Status: DC | PRN
Start: 2015-06-14 — End: 2015-10-09

## 2015-06-14 MED ORDER — PROMETHAZINE HCL 12.5 MG PO TABS
25.0000 mg | ORAL_TABLET | Freq: Once | ORAL | Status: AC
  Administered 2015-06-14: 25 mg via ORAL
  Filled 2015-06-14: qty 2

## 2015-06-14 NOTE — Discharge Instructions (Signed)
Your vital signs are within normal limits. The emergency department is happy to evaluate emergent situations, however we are not set up for medication refills, or for pain management. Please see Dr. Ronnald Ramp or a member of her team for assistance with your medication, or see the physicians at the free clinic here in [place]. Please see Dr.Rehman concerning the problems with your stomach and GI system. Use promethazine every 6 hours for nausea. This medication may cause drowsiness, please use with caution.

## 2015-06-14 NOTE — ED Notes (Signed)
Patient states she has been out of her narcotic pain medication X4 days, patient requesting pain medication at this time.

## 2015-06-14 NOTE — ED Provider Notes (Signed)
CSN: [id number]     Arrival date & time 06/14/15  2107 History   First MD Initiated Contact with Patient 06/14/15 2132     Chief Complaint  Patient presents with  . Medication Refill     (Consider location/radiation/quality/duration/timing/severity/associated sxs/prior Treatment) HPI Comments: Patient is a 50 year old female who presents to the emergency department with a requests for medication refill.  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  The patient states that she was with a primary care practice that was treating her chronic pain with Roxicet. The patient was dismissed from this practice approximately a month ago. The patient states that she has not had any narcotic medication 4 or for 5 days. She presents to the emergency department with a requests to have her narcotic pain medication refill. The patient states she is on Xanax and Neurontin and other medications, but these do little to actually help with her pain. She states she is attempting to find a new physician, but has not been successful as of yet.  The patient also states that she is having what she thinks to be a change in her stool, she states that they are much smaller than usual. No reported blood in the stool No reported unusual weight loss.  The history is provided by the patient.    Past Medical History  Diagnosis Date  . Asthma   . COPD (chronic obstructive pulmonary disease) ([place])   . Hep C w/o coma, chronic ([place])   . Cancer ([place])   . Hypertension   . Diabetes mellitus   . Pneumonia   . 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 DDD (degenerative disc disease)   . Factor 5 Leiden mutation, heterozygous [facility])    Past Surgical History  Procedure Laterality Date  . Abdominal hysterectomy    . Back surgery      Family History  Problem Relation Age of Onset  . Diabetes Mother   . Cancer Mother   . Cancer Other    Social History  Substance Use Topics  . Smoking status: Current Every Day Smoker -- 1.00 packs/day for 37 years    Types: Cigarettes  . Smokeless tobacco: Never Used  . Alcohol Use: No   OB History    No data available     Review of Systems    Allergies  Cephalexin; Abilify; Augmentin; Penicillins; Seroquel; Aspirin; and Sulfa antibiotics  Home Medications   Prior to Admission medications   Medication Sig Start Date End Date Taking? Authorizing Provider  albuterol (PROVENTIL HFA;VENTOLIN HFA) 108 (90 BASE) MCG/ACT inhaler Inhale 2 puffs into the lungs every 6 (six) hours as needed. For shortness of breath     Historical Provider, MD  alprazolam Duanne Moron) 2 MG tablet Take 2 mg by mouth 3 (three) times daily.      Historical Provider, MD  antiseptic oral rinse (BIOTENE) LIQD 15 mLs by Mouth Rinse route as needed (for dry mouth relief).    Historical Provider, MD  DULoxetine (CYMBALTA) 60 MG capsule Take 60 mg by mouth 2 (two) times daily.     Historical Provider, MD  esomeprazole (NEXIUM) 20 MG capsule Take 20 mg by mouth daily at 12 noon.    Historical Provider, MD  Fluticasone-Salmeterol (ADVAIR DISKUS) 250-50 MCG/DOSE AEPB Inhale 1 puff into the lungs every 12 (twelve) hours.      Historical Provider,  MD  gabapentin (NEURONTIN) 600 MG tablet Take 1,200 mg by mouth 2 (two) times daily.      Historical Provider, MD  hydrochlorothiazide (HYDRODIURIL) 25 MG tablet Take 25 mg by mouth daily.      Historical Provider, MD  levalbuterol Penne Lash) 1.25 MG/3ML nebulizer solution Take 1.25 mg by nebulization every 4 (four) hours as needed for wheezing or shortness of breath.    Historical Provider, MD  loratadine (CLARITIN) 10 MG tablet Take 10 mg by mouth daily.      Historical Provider, MD  mupirocin ointment (BACTROBAN) 2 % Apply 1 application topically 3 (three) times daily.     Historical Provider, MD  oxycodone (ROXICODONE) 30 MG immediate release tablet Take 60 mg by mouth every 4 (four) hours as needed for pain (May take every 4 to 6 hours as needed for pain).     Historical Provider, MD  potassium chloride (K-DUR) 10 MEQ tablet Take 20 mEq by mouth daily.    Historical Provider, MD  promethazine (PHENERGAN) 25 MG suppository Place 1 suppository (25 mg total) rectally every 6 (six) hours as needed for nausea or vomiting. 06/14/15   Lily Kocher, PA-C  Umeclidinium Bromide (INCRUSE ELLIPTA) 62.5 MCG/INH AEPB Inhale 1 puff into the lungs daily.    Historical Provider, MD   BP 146/92 mmHg  Pulse 86  Temp(Src) 98.2 F (36.8 C) (Oral)  Resp 20  Ht 5\' 1"  (1.549 m)  Wt 72.576 kg  BMI 30.25 kg/m2  SpO2 99% Physical Exam  Constitutional: She is oriented to person, place, and time. She appears well-developed and well-nourished.  Non-toxic appearance.  HENT:  Head: Normocephalic.  Right Ear: Tympanic membrane and external ear normal.  Left Ear: Tympanic membrane and external ear normal.  Eyes: EOM and lids are normal. Pupils are equal, round, and reactive to light.  Neck: Normal range of motion. Neck supple. Carotid bruit is not present.  Cardiovascular: Normal rate, regular rhythm, normal heart sounds, intact distal pulses and normal pulses.  Exam reveals no gallop and no friction rub.   No murmur heard. Pulmonary/Chest: Breath sounds normal. No respiratory distress. She has no wheezes. She has no rales.  Abdominal: Soft. Bowel sounds are normal. There is no tenderness. There is no guarding.  Musculoskeletal: Normal range of motion.  Patient has pain of the lower back with change of position. She complains of soreness of the knees when standing and during the examination for range of motion.  Lymphadenopathy:       Head (right side): No submandibular adenopathy present.       Head (left side): No submandibular adenopathy present.    She has no cervical adenopathy.   Neurological: She is alert and oriented to person, place, and time. She has normal strength. No cranial nerve deficit or sensory deficit.  Skin: Skin is warm and dry.  There are a few open sores about the cheeks of the face.  Psychiatric: She has a normal mood and affect. Her speech is normal.  Agitated when her requests for narcotic pain medication was not honored.  Nursing note and vitals reviewed.   ED Course  Procedures (including critical care time) Labs Review Labs Reviewed - No data to display  Imaging Review No results found. I have personally reviewed and evaluated these images and lab results as part of my medical decision-making.   EKG Interpretation None      MDM   There is no reported history of excessive muscle aches or lacrimation  or excessive sweating or yawning on. The patient appears somewhat agitated after not receiving prescription for narcotic pain medication. The patient also states that she has issues with nausea, but there was no vomiting and no complaint of nausea while here in the emergency department, until discharge when the patient noted that she was not receiving narcotic pain medication. There are no major changes in vital signs. I doubt any acute withdrawal issues at this time.  Patient referred to the free clinic of [place]. The patient is also referred to Dr. Laural Golden for evaluation of her GI related issues.  I offered the patient nonnarcotic medications for her discomfort. She refused the offer, and stated that she already had Xanax and Neurontin and some others, and these were not helping.    Final diagnoses:  Encounter for medication refill    **I have reviewed nursing notes, vital signs, and all appropriate lab and imaging results for this patient.Lily Kocher, PA-C 06/14/15 [place], MD 06/22/15 [phone]

## 2015-06-14 NOTE — ED Notes (Signed)
Pt upset that she did not get refill on pain medication, pt did take phenergan for her symptoms and reviewed her prescription.  Going over her instructions and pt interrupted and started in on how she did not have a doctor anymore and ranting about how she takes pain medication for DDD for years.  Pt would not let me finish her instructions and pt refused to stay to sign for d/c instructions.  Sima Matas, PA is aware of pt's behavior.  D/c instructions handed to pt's sitter that was in room at time as well.

## 2015-09-04 ENCOUNTER — Telehealth: Payer: Self-pay | Admitting: Physician Assistant

## 2015-09-06 NOTE — Telephone Encounter (Signed)
I spoke with Rob and he stated since [name] is a part of our practice now and she was discharged from [place] that we will not be able to see her as a patient.

## 2015-09-06 NOTE — Telephone Encounter (Signed)
Patient aware of decision

## 2015-09-14 ENCOUNTER — Encounter: Payer: Self-pay | Admitting: Gastroenterology

## 2015-09-24 ENCOUNTER — Other Ambulatory Visit: Payer: Self-pay | Admitting: Physician Assistant

## 2015-09-24 NOTE — Telephone Encounter (Signed)
I discharged this patient from [facility] and have told other providers at this office we should not take her. This is denied.

## 2015-10-02 ENCOUNTER — Other Ambulatory Visit: Payer: Self-pay | Admitting: Physician Assistant

## 2015-10-09 ENCOUNTER — Ambulatory Visit (INDEPENDENT_AMBULATORY_CARE_PROVIDER_SITE_OTHER): Payer: Medicaid Other | Admitting: Gastroenterology

## 2015-10-09 ENCOUNTER — Encounter: Payer: Self-pay | Admitting: Gastroenterology

## 2015-10-09 VITALS — BP 132/84 | HR 74 | Temp 98.2°F | Ht 61.0 in | Wt 162.6 lb

## 2015-10-09 DIAGNOSIS — B182 Chronic viral hepatitis C: Secondary | ICD-10-CM | POA: Diagnosis not present

## 2015-10-09 LAB — CBC WITH DIFFERENTIAL/PLATELET
Basophils Absolute: 0 cells/uL (ref 0–200)
Basophils Relative: 0 %
EOS PCT: 2 %
Eosinophils Absolute: 168 cells/uL (ref 15–500)
HEMATOCRIT: 46.7 % — AB (ref 35.0–45.0)
HEMOGLOBIN: 16.3 g/dL — AB (ref 11.7–15.5)
LYMPHS ABS: 4116 {cells}/uL — AB (ref 850–3900)
Lymphocytes Relative: 49 %
MCH: 32.9 pg (ref 27.0–33.0)
MCHC: 34.9 g/dL (ref 32.0–36.0)
MCV: 94.3 fL (ref 80.0–100.0)
MONO ABS: 756 {cells}/uL (ref 200–950)
MPV: 10 fL (ref 7.5–12.5)
Monocytes Relative: 9 %
NEUTROS ABS: 3360 {cells}/uL (ref 1500–7800)
Neutrophils Relative %: 40 %
Platelets: 261 10*3/uL (ref 140–400)
RBC: 4.95 MIL/uL (ref 3.80–5.10)
RDW: 12.6 % (ref 11.0–15.0)
WBC: 8.4 10*3/uL (ref 3.8–10.8)

## 2015-10-09 MED ORDER — OMEPRAZOLE 20 MG PO CPDR: 20.0000 mg | DELAYED_RELEASE_CAPSULE | Freq: Every day | ORAL | 3 refills | Status: DC

## 2015-10-09 NOTE — Patient Instructions (Signed)
1. Please have your labs and ultrasound done. 2. Read over the [facility] patient information booklet. Call with any questions.  3. Stop Nexium. Start omeprazole once daily.   Hepatitis C Hepatitis C is a viral infection of the liver. It can lead to scarring of the liver (cirrhosis), liver failure, or liver cancer. Hepatitis C may go undetected for months or years because people with the infection may not have symptoms, or they may have only mild symptoms. CAUSES  Hepatitis C is caused by the hepatitis C virus (HCV). The virus can be passed from one person to another through:  Blood.  Contaminated needles, such as those used for tattooing, body piercing, acupuncture, or injecting drugs.  Having unprotected sex with an infected person.  Childbirth.  Blood transfusions or organ transplants done in the [place] before 1992. RISK FACTORS Risk factors for hepatitis C include:  Having unprotected sex with an infected person.  Using illegal drugs. SIGNS AND SYMPTOMS  Symptoms of hepatitis C may include:  Fatigue.  Loss of appetite.  Nausea.  Vomiting.  Abdominal pain.  Dark yellow urine.  Yellowish skin and eyes (jaundice).  Itching of the skin.  Clay-colored bowel movements.  Joint pain. Symptoms are not always present.  DIAGNOSIS  Hepatitis C is diagnosed with blood tests. Other types of tests may also be done to check how your liver is functioning. TREATMENT  Your health care provider may perform noninvasive tests or a liver biopsy to help determine the best course of treatment. Treatment for hepatitis C may include one or more medicines. Your health care provider may check you for a recurring infection or other liver conditions every 6-12 months after treatment. HOME CARE INSTRUCTIONS   Rest as needed.  Take all medicines as directed by your health care provider.  Do not take any medicine unless approved by your health care provider. This includes  over-the-counter medicine and birth control pills.  Do not drink alcohol.  Do not have sex until approved by your health care provider.  Do not share toothbrushes, nail clippers, razors, or needles. PREVENTION There is no vaccine for hepatitis C. The only way to prevent the disease is to reduce the risk of exposure to the virus. This may be done by:  Practicing safe sex and using condoms.  Avoiding illegal drugs. SEEK MEDICAL CARE IF:  You have a fever.  You develop abdominal pain.  You develop dark urine.  You have clay-colored bowel movements.  You develop joint pains. SEEK IMMEDIATE MEDICAL CARE IF:  You have increasing fatigue or weakness.  You lose your appetite.  You feel nauseous or vomit.  You develop jaundice or your jaundice gets worse.  You bruise or bleed easily. MAKE SURE YOU:   Understand these instructions.  Will watch your condition.  Will get help right away if you are not doing well or get worse.   This information is not intended to replace advice given to you by your health care provider. Make sure you discuss any questions you have with your health care provider.   Document Released: 12/21/1999 Document Revised: 01/13/2014 Document Reviewed: 04/06/2013 Elsevier Interactive Patient Education [employer].

## 2015-10-09 NOTE — Progress Notes (Addendum)
REVIEWED-NO ADDITIONAL RECOMMENDATIONS.   Primary Care Physician: Rosita Fire, MD  Primary Gastroenterologist:  Barney Drain, MD   Chief Complaint  Patient presents with  . Hepatitis C    HPI: [name] is a 50 y.o. female here at the request of PC for further evaluation of chronic HCV. Patient presents with her mother and aide today. Patient states she was seen in [facility] at [facility] for chronic HCV. States she was told she had cirrhosis and six months to liver. No treatment offered. Risk factors include being institutionalized for six years. She states she shared razors. She got a tattoo at age 28 using reused needles. She has never received a blood transfusion. She does have h/o drug use, needle use in the 1980s, intranasal use greater than six months ago. She doesn't drink etoh. She was on opiods but has been off for six months.   Patient states she has intermittent ruq discomfort when "my liver swells". She has fatigue. No n/v. BM regular. No melena, brbpr. No unintentional weight loss.    She has h/o Factor V leiden and has never been on blood thinner. Daddy had same condition.     Current Outpatient Prescriptions  Medication Sig Dispense Refill  . albuterol (PROVENTIL HFA;VENTOLIN HFA) 108 (90 BASE) MCG/ACT inhaler Inhale 2 puffs into the lungs every 6 (six) hours as needed. For shortness of breath     . DULoxetine (CYMBALTA) 60 MG capsule Take 60 mg by mouth 2 (two) times daily.     Marland Kitchen esomeprazole (NEXIUM) 20 MG capsule Take 20 mg by mouth daily at 12 noon.    . Fluticasone-Salmeterol (ADVAIR DISKUS) 250-50 MCG/DOSE AEPB Inhale 1 puff into the lungs every 12 (twelve) hours.      . gabapentin (NEURONTIN) 600 MG tablet Take 1,200 mg by mouth 2 (two) times daily.      . hydrochlorothiazide (HYDRODIURIL) 25 MG tablet Take 25 mg by mouth daily.      Marland Kitchen levalbuterol (XOPENEX) 1.25 MG/3ML nebulizer solution Take 1.25 mg by nebulization every 4 (four) hours as needed for wheezing or  shortness of breath.    . loratadine (CLARITIN) 10 MG tablet Take 10 mg by mouth daily.      . potassium chloride (K-DUR) 10 MEQ tablet Take 20 mEq by mouth 2 (two) times daily.     Marland Kitchen Umeclidinium Bromide (INCRUSE ELLIPTA) 62.5 MCG/INH AEPB Inhale 1 puff into the lungs daily.     No current facility-administered medications for this visit.     Allergies as of 10/09/2015 - Review Complete 10/09/2015  Allergen Reaction Noted  . Cephalexin Shortness Of Breath 01/10/2011  . Abilify [aripiprazole] Nausea And Vomiting 11/04/2014  . Augmentin [amoxicillin-pot clavulanate] Nausea And Vomiting 09/25/2012  . Penicillins Nausea And Vomiting 09/25/2012  . Seroquel [quetiapine fumarate]  11/04/2014  . Aspirin Swelling and Rash 09/25/2012  . Sulfa antibiotics Rash and Other (See Comments) 09/25/2012   Past Medical History:  Diagnosis Date  . Asthma   . Bipolar disease, chronic ([place])   . Cancer (HCC)    cervical cancer. partial hysterectomy in 20s.   . Cervical cancer ([place])   . COPD (chronic obstructive pulmonary disease) ([place])   . DDD (degenerative disc disease)   . Diabetes mellitus    not on medication  . Factor 5 Leiden mutation, heterozygous ([place])   . Hep C w/o coma, chronic ([place])   . Hypertension   . 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 Pneumonia   .  Seizures ([place])    Past Surgical History:  Procedure Laterality Date  . ABDOMINAL HYSTERECTOMY     cervical cancer  . BACK SURGERY    . carpel tunnel    . COLONOSCOPY  20 yrs   [place]   Family History  Problem Relation Age of Onset  . Diabetes Mother   . Cancer Mother     breast   . Cancer Other   . Colon cancer Neg Hx   . Liver disease Neg Hx    Social History   Social History  . Marital status: Single    Spouse name: N/A  . Number of children: N/A  . Years of education: N/A   Social History Main Topics  . Smoking status: Current Every Day Smoker    Packs/day: 1.00    Years: 37.00     Types: Cigarettes  . Smokeless tobacco: Never Used  . Alcohol use No  . Drug use:     Types: Marijuana     Comment: remote IV drugs. last intranasal drugs 04/2015  . Sexual activity: Not Asked   Other Topics Concern  . None   Social History Narrative  . None     ROS:  General: Negative for anorexia, weight loss, fever, chills, weakness.+fatigue ENT: Negative for hoarseness, difficulty swallowing , nasal congestion. CV: Negative for chest pain, angina, palpitations, dyspnea on exertion, peripheral edema.  Respiratory: Negative for dyspnea at rest, dyspnea on exertion, cough, sputum, wheezing.  GI: See history of present illness. GU:  Negative for dysuria, hematuria, urinary incontinence, urinary frequency, nocturnal urination.  Endo: Negative for unusual weight change.    Physical Examination:   BP 132/84   Pulse 74   Temp 98.2 F (36.8 C) (Oral)   Ht 5\' 1"  (1.549 m)   Wt 162 lb 9.6 oz (73.8 kg)   BMI 30.72 kg/m   General: Well-nourished, well-developed in no acute distress.  Eyes: No icterus. Mouth: Oropharyngeal mucosa moist and pink , no lesions erythema or exudate. Lungs: Clear to auscultation bilaterally.  Heart: Regular rate and rhythm, no murmurs rubs or gallops.  Abdomen: Bowel sounds are normal, nontender, nondistended, no hepatosplenomegaly or masses, no abdominal bruits or hernia , no rebound or guarding.   Extremities: No lower extremity edema. No clubbing or deformities. Neuro: Alert and oriented x 4   Skin: Warm and dry, no jaundice.   Psych: Alert and cooperative, normal mood and affect.  Labs:  08/2015 Creatinine 0.75, wbc 6700, H/H 15.3/44.4, MCV 99.1, Platelet 269000, HCV RNA 706110, Tbili 0.8, AP 93, AST 68, ALT 89, Alb 4, Hep B surf Ab NR, Hep B surf Ag NR,   Imaging Studies: No results found.

## 2015-10-10 LAB — PROTIME-INR
INR: 1
Prothrombin Time: 11 s (ref 9.0–11.5)

## 2015-10-10 LAB — COMPREHENSIVE METABOLIC PANEL
ALBUMIN: 4.2 g/dL (ref 3.6–5.1)
ALT: 59 U/L — AB (ref 6–29)
AST: 51 U/L — AB (ref 10–35)
Alkaline Phosphatase: 110 U/L (ref 33–130)
BUN: 8 mg/dL (ref 7–25)
CALCIUM: 9.7 mg/dL (ref 8.6–10.4)
CO2: 26 mmol/L (ref 20–31)
CREATININE: 0.7 mg/dL (ref 0.50–1.05)
Chloride: 103 mmol/L (ref 98–110)
Glucose, Bld: 91 mg/dL (ref 65–99)
Potassium: 4.3 mmol/L (ref 3.5–5.3)
SODIUM: 138 mmol/L (ref 135–146)
TOTAL PROTEIN: 7.6 g/dL (ref 6.1–8.1)
Total Bilirubin: 0.7 mg/dL (ref 0.2–1.2)

## 2015-10-10 LAB — HEPATITIS B CORE ANTIBODY, TOTAL: Hep B Core Total Ab: REACTIVE — AB

## 2015-10-10 LAB — HCV RNA QUANT RFLX ULTRA OR GENOTYP
HCV QUANT LOG: 6.04 {Log} — AB (ref <1.18)
HCV QUANT: 1084174 [IU]/mL — AB (ref <15)

## 2015-10-11 ENCOUNTER — Encounter: Payer: Self-pay | Admitting: Gastroenterology

## 2015-10-11 NOTE — Assessment & Plan Note (Addendum)
50 y/o female with h/o chronic HCV, treatment naive, ?cirrhosis based on history provided. She is interested in HCV treatment. Discussed at length with patient. She is aware that if she undergoes treatment and is cured, that she can get reinfected with HCV if she continues risky behavior. She is current six months out from drug use. No etoh use. Highly motivated to have treatment. She will have labs and u/s with elastography. Further recommendations to follow.   Will go ahead and switch nexium to omeprazole 20 mg daily in accordance to harvoni recommendations.

## 2015-10-12 ENCOUNTER — Ambulatory Visit (HOSPITAL_COMMUNITY)
Admission: RE | Admit: 2015-10-12 | Discharge: 2015-10-12 | Disposition: A | Discharge status: Home / Self Care | Payer: Medicaid Other | Source: Ambulatory Visit | Attending: Gastroenterology | Admitting: Gastroenterology

## 2015-10-12 DIAGNOSIS — B182 Chronic viral hepatitis C: Secondary | ICD-10-CM | POA: Diagnosis present

## 2015-10-12 NOTE — Progress Notes (Signed)
CC'ED TO PCP 

## 2015-10-12 NOTE — Progress Notes (Signed)
She is not my patient anymore. I had discharged her from [facility] many months ago.  Just for your information.

## 2015-10-15 LAB — HEPATITIS C GENOTYPE

## 2015-10-18 ENCOUNTER — Telehealth: Payer: Self-pay | Admitting: Gastroenterology

## 2015-10-18 NOTE — Telephone Encounter (Signed)
Pt called to follow up on her ultrasound results. Please call  [phone]

## 2015-10-18 NOTE — Telephone Encounter (Signed)
Please advise if results are back

## 2015-10-21 NOTE — Telephone Encounter (Signed)
See result note.  

## 2015-10-21 NOTE — Progress Notes (Signed)
F2 and some F3, smooth liver border. No overt cirrhosis. See lab results note.

## 2015-10-21 NOTE — Progress Notes (Signed)
Please let patient know that I have to discuss one test result with Hep C specialist before we start Hep C treatment. Hopefully will have next step decided Monday.

## 2015-10-23 NOTE — Telephone Encounter (Signed)
Noted  

## 2015-10-29 ENCOUNTER — Telehealth: Payer: Self-pay | Admitting: Gastroenterology

## 2015-10-29 NOTE — Telephone Encounter (Signed)
Please start the [facility] approval process. I discuss Hep B core total Ab result with [facility]. OK to begin HCV treatment. Watch AST/ALT closely for rise.

## 2015-10-29 NOTE — Telephone Encounter (Signed)
harvoni orders are on [facility] desk. Pt has [place] medicaid and will need to fill out and sign paper that is required by Medicaid before it can be approved. I called the pt and spoke with her Aide, she is going to come by today and get the paper and have the pt fill it out and sign it.

## 2015-10-29 NOTE — Telephone Encounter (Signed)
Completed.

## 2015-11-01 ENCOUNTER — Telehealth: Payer: Self-pay | Admitting: Gastroenterology

## 2015-11-01 NOTE — Telephone Encounter (Signed)
[facility] called to say that patient's harvoni would be delivered on Tuesday 10/31.

## 2015-11-01 NOTE — Telephone Encounter (Signed)
noted 

## 2015-11-06 NOTE — Telephone Encounter (Signed)
Stacey, please schedule ov.  

## 2015-11-06 NOTE — Telephone Encounter (Signed)
Harvoni has been delivered. What instructions do you want me to give the pt?

## 2015-11-06 NOTE — Telephone Encounter (Signed)
Needs OV prior to starting Harvoni.

## 2015-11-07 ENCOUNTER — Encounter: Payer: Self-pay | Admitting: Gastroenterology

## 2015-11-07 NOTE — Telephone Encounter (Signed)
APPT MADE AND LETTER SENT  °

## 2015-11-23 ENCOUNTER — Encounter: Payer: Self-pay | Admitting: Gastroenterology

## 2015-11-23 ENCOUNTER — Ambulatory Visit (INDEPENDENT_AMBULATORY_CARE_PROVIDER_SITE_OTHER): Payer: Medicaid Other | Admitting: Gastroenterology

## 2015-11-23 VITALS — BP 135/93 | HR 98 | Temp 98.4°F | Ht 65.0 in | Wt 169.0 lb

## 2015-11-23 DIAGNOSIS — B182 Chronic viral hepatitis C: Secondary | ICD-10-CM | POA: Diagnosis not present

## 2015-11-23 NOTE — Progress Notes (Addendum)
REVIEWED-NO ADDITIONAL RECOMMENDATIONS.   Primary Care Physician: Rosita Fire, MD  Primary Gastroenterologist:  Barney Drain, MD   Chief Complaint  Patient presents with  . Hepatitis C    HPI: [name] is a 50 y.o. female here for f/u chronic HCV. She presents to start [place]. Genotype 1a, treatment naive. HCV RNA [id number] IU/mL.U/S with elastography with F2 and some F3. Plans for 8 weeks of Harvoni.   She is feeling well. No complaints. Anxious to complete Harvoni.   H/o Hep B core total ab positive. Hep B surf Ag neg. Hep B surf Ab neg. Discussed with [name], NP at [facility] liver care who recommends proceeding with Hep C treatment but to watch for bump in LFTs which could indicate active Hep B.    Current Outpatient Prescriptions  Medication Sig Dispense Refill  . albuterol (PROVENTIL HFA;VENTOLIN HFA) 108 (90 BASE) MCG/ACT inhaler Inhale 2 puffs into the lungs every 6 (six) hours as needed. For shortness of breath     . DULoxetine (CYMBALTA) 60 MG capsule Take 60 mg by mouth 2 (two) times daily.     . Fluticasone-Salmeterol (ADVAIR DISKUS) 250-50 MCG/DOSE AEPB Inhale 1 puff into the lungs every 12 (twelve) hours.      . gabapentin (NEURONTIN) 600 MG tablet Take 1,200 mg by mouth 2 (two) times daily.      . hydrochlorothiazide (HYDRODIURIL) 25 MG tablet Take 25 mg by mouth daily.      Marland Kitchen levalbuterol (XOPENEX) 1.25 MG/3ML nebulizer solution Take 1.25 mg by nebulization every 4 (four) hours as needed for wheezing or shortness of breath.    . loratadine (CLARITIN) 10 MG tablet Take 10 mg by mouth daily.      Marland Kitchen omeprazole (PRILOSEC) 20 MG capsule Take 1 capsule (20 mg total) by mouth daily. 90 capsule 3  . potassium chloride (K-DUR) 10 MEQ tablet Take 20 mEq by mouth 2 (two) times daily.     Marland Kitchen Umeclidinium Bromide (INCRUSE ELLIPTA) 62.5 MCG/INH AEPB Inhale 1 puff into the lungs daily.     No current facility-administered medications for this visit.     Allergies as  of 11/23/2015 - Review Complete 11/23/2015  Allergen Reaction Noted  . Cephalexin Shortness Of Breath 01/10/2011  . Abilify [aripiprazole] Nausea And Vomiting 11/04/2014  . Augmentin [amoxicillin-pot clavulanate] Nausea And Vomiting 09/25/2012  . Penicillins Nausea And Vomiting 09/25/2012  . Seroquel [quetiapine fumarate]  11/04/2014  . Aspirin Swelling and Rash 09/25/2012  . Sulfa antibiotics Rash and Other (See Comments) 09/25/2012    ROS:  General: Negative for anorexia, weight loss, fever, chills, fatigue, weakness. ENT: Negative for hoarseness, difficulty swallowing , nasal congestion. CV: Negative for chest pain, angina, palpitations, dyspnea on exertion, peripheral edema.  Respiratory: Negative for dyspnea at rest, dyspnea on exertion, cough, sputum, wheezing.  GI: See history of present illness. GU:  Negative for dysuria, hematuria, urinary incontinence, urinary frequency, nocturnal urination.  Endo: Negative for unusual weight change.    Physical Examination:   BP (!) 135/93   Pulse 98   Temp 98.4 F (36.9 C) (Oral)   Ht 5\' 5"  (1.651 m)   Wt 169 lb (76.7 kg)   BMI 28.12 kg/m   General: Well-nourished, well-developed in no acute distress.  Eyes: No icterus. Mouth: Oropharyngeal mucosa moist and pink , no lesions erythema or exudate. Lungs: Clear to auscultation bilaterally.  Heart: Regular rate and rhythm, no murmurs rubs or gallops.  Abdomen: Bowel sounds are normal, nontender, nondistended, no  hepatosplenomegaly or masses, no abdominal bruits or hernia , no rebound or guarding.   Extremities: No lower extremity edema. No clubbing or deformities. Neuro: Alert and oriented x 4   Skin: Warm and dry, no jaundice.   Psych: Alert and cooperative, normal mood and affect.  Labs:  Lab Results  Component Value Date   CREATININE 0.70 10/09/2015   BUN 8 10/09/2015   NA 138 10/09/2015   K 4.3 10/09/2015   CL 103 10/09/2015   CO2 26 10/09/2015   Lab Results    Component Value Date   ALT 59 (H) 10/09/2015   AST 51 (H) 10/09/2015   ALKPHOS 110 10/09/2015   BILITOT 0.7 10/09/2015   Lab Results  Component Value Date   INR 1.0 10/09/2015   INR 1.1 12/19/2007   INR 1.0 11/29/2007    Imaging Studies: No results found.

## 2015-11-23 NOTE — Patient Instructions (Signed)
1. You can try and stop the omeprazole for heartburn but if you decide you really need it make sure you take it at the same time as the [place]. 2. Take Harvoni at the same time every day for the next 8 weeks.  3. We will repeat your labs in 2 weeks and again in 4 weeks. We will see you back in the office in 4 weeks.  4. If you need to start a new medication either prescribed or over the counter, please get it approved by [place] first to make sure it does not interfere with the [place].

## 2015-11-25 NOTE — Assessment & Plan Note (Signed)
Begin Harvoni 11/24/15.   1. You can try and stop the omeprazole for heartburn but if you decide you really need it make sure you take it at the same time as the [place]. 2. Take Harvoni at the same time every day for the next 8 weeks.  3. We will repeat your labs in 2 weeks and again in 4 weeks. We will see you back in the office in 4 weeks.  4. If you need to start a new medication either prescribed or over the counter, please get it approved by [place] first to make sure it does not interfere with the [place].

## 2015-11-26 ENCOUNTER — Other Ambulatory Visit: Payer: Self-pay

## 2015-11-26 ENCOUNTER — Other Ambulatory Visit: Payer: Self-pay | Admitting: Gastroenterology

## 2015-11-26 DIAGNOSIS — B182 Chronic viral hepatitis C: Secondary | ICD-10-CM

## 2015-11-26 NOTE — Progress Notes (Signed)
cc'ed to pcp °

## 2015-12-03 ENCOUNTER — Telehealth: Payer: Self-pay

## 2015-12-03 NOTE — Telephone Encounter (Signed)
[facility] called- they will be delivering the pts harvoni on 12/12/15.

## 2015-12-07 LAB — HEPATIC FUNCTION PANEL
ALBUMIN: 4.3 g/dL (ref 3.6–5.1)
ALT: 13 U/L (ref 6–29)
AST: 16 U/L (ref 10–35)
Alkaline Phosphatase: 97 U/L (ref 33–130)
BILIRUBIN INDIRECT: 0.4 mg/dL (ref 0.2–1.2)
Bilirubin, Direct: 0.1 mg/dL (ref <=0.2)
TOTAL PROTEIN: 7.3 g/dL (ref 6.1–8.1)
Total Bilirubin: 0.5 mg/dL (ref 0.2–1.2)

## 2015-12-11 NOTE — Progress Notes (Signed)
LFTs normal. Almyra Free, can you make patient gets the other labs done in 2 weeks as planned.

## 2015-12-13 ENCOUNTER — Other Ambulatory Visit (HOSPITAL_COMMUNITY): Payer: Self-pay | Admitting: Internal Medicine

## 2015-12-13 DIAGNOSIS — Z1231 Encounter for screening mammogram for malignant neoplasm of breast: Secondary | ICD-10-CM

## 2015-12-14 ENCOUNTER — Telehealth: Payer: Self-pay | Admitting: Internal Medicine

## 2015-12-14 NOTE — Telephone Encounter (Signed)
Pt was returning call. Please call her at [phone]

## 2015-12-14 NOTE — Telephone Encounter (Signed)
I spoke with the pt.  

## 2015-12-24 ENCOUNTER — Ambulatory Visit (INDEPENDENT_AMBULATORY_CARE_PROVIDER_SITE_OTHER): Payer: Medicaid Other | Admitting: Gastroenterology

## 2015-12-24 ENCOUNTER — Encounter: Payer: Self-pay | Admitting: Gastroenterology

## 2015-12-24 ENCOUNTER — Ambulatory Visit (HOSPITAL_COMMUNITY)
Admission: RE | Admit: 2015-12-24 | Discharge: 2015-12-24 | Disposition: A | Discharge status: Home / Self Care | Payer: Medicaid Other | Source: Ambulatory Visit | Attending: Internal Medicine | Admitting: Internal Medicine

## 2015-12-24 VITALS — BP 113/78 | HR 85 | Temp 97.9°F | Ht 61.0 in | Wt 168.0 lb

## 2015-12-24 DIAGNOSIS — Z1231 Encounter for screening mammogram for malignant neoplasm of breast: Secondary | ICD-10-CM | POA: Insufficient documentation

## 2015-12-24 DIAGNOSIS — B182 Chronic viral hepatitis C: Secondary | ICD-10-CM | POA: Diagnosis not present

## 2015-12-24 NOTE — Patient Instructions (Signed)
1. Please see Dr. Legrand Rams today regarding concerns for blood clot. 2. Continue Harvoni. You need to complete total of 8 weeks of therapy. 3. Have your blood work done today. We will call you with results as soon as available.   HAVE A MERRY CHRISTMAS AND HAPPY NEW YEAR!

## 2015-12-24 NOTE — Progress Notes (Signed)
Primary Care Physician: Rosita Fire, MD  Primary Gastroenterologist:  Barney Drain, MD   Chief Complaint  Patient presents with  . Hepatitis C    Harvoni f/u, doing ok    HPI: [name] is a 50 y.o. female hereFor four-week follow-up. Started harvoni for chronic hepatitis C, genotype 1A, treatment nave. Ultrasound with elastography, F2 and some S3. Plans for 8 weeks of therapy.  Feeling good. No complaints. Denies abdominal pain. No insomnia. No headaches. Plans to go get her first HCV RNA testing today. History of hepatitis B core total antibody positive with negative surface antigen and negative surface antibody. We are monitoring her LFTs to watch for a bump which could indicate active hep B. So far her LFTs into therapy have been normal.  She takes her omeprazole 20 mg daily at the same time she takes Harvoni. Good control of her reflux.  She has a history of factor V Leiden but states she's never been on a blood thinner. Her father has the same condition. Patient is concerned about a swelling over the right hand which is painful and is concerned about clot. She also concerned about right foot pain but notes no swelling.  She is due for updated colonoscopy, we plan to have this done after her hepatitis C treatment is completed.  Current Outpatient Prescriptions  Medication Sig Dispense Refill  . albuterol (PROVENTIL HFA;VENTOLIN HFA) 108 (90 BASE) MCG/ACT inhaler Inhale 2 puffs into the lungs every 6 (six) hours as needed. For shortness of breath     . DULoxetine (CYMBALTA) 60 MG capsule Take 60 mg by mouth 2 (two) times daily.     . Fluticasone-Salmeterol (ADVAIR DISKUS) 250-50 MCG/DOSE AEPB Inhale 1 puff into the lungs every 12 (twelve) hours.      . gabapentin (NEURONTIN) 600 MG tablet Take 1,200 mg by mouth 2 (two) times daily.      . hydrochlorothiazide (HYDRODIURIL) 25 MG tablet Take 25 mg by mouth daily.      . Ledipasvir-Sofosbuvir (HARVONI PO) Take 1  tablet by mouth daily.    Marland Kitchen levalbuterol (XOPENEX) 1.25 MG/3ML nebulizer solution Take 1.25 mg by nebulization every 4 (four) hours as needed for wheezing or shortness of breath.    . loratadine (CLARITIN) 10 MG tablet Take 10 mg by mouth daily.      Marland Kitchen omeprazole (PRILOSEC) 20 MG capsule Take 1 capsule (20 mg total) by mouth daily. 90 capsule 3  . potassium chloride (K-DUR) 10 MEQ tablet Take 20 mEq by mouth 2 (two) times daily.     Marland Kitchen Umeclidinium Bromide (INCRUSE ELLIPTA) 62.5 MCG/INH AEPB Inhale 1 puff into the lungs daily.     No current facility-administered medications for this visit.     Allergies as of 12/24/2015 - Review Complete 12/24/2015  Allergen Reaction Noted  . Cephalexin Shortness Of Breath 01/10/2011  . Abilify [aripiprazole] Nausea And Vomiting 11/04/2014  . Augmentin [amoxicillin-pot clavulanate] Nausea And Vomiting 09/25/2012  . Penicillins Nausea And Vomiting 09/25/2012  . Seroquel [quetiapine fumarate]  11/04/2014  . Aspirin Swelling and Rash 09/25/2012  . Sulfa antibiotics Rash and Other (See Comments) 09/25/2012    ROS:  General: Negative for anorexia, weight loss, fever, chills, fatigue, weakness. ENT: Negative for hoarseness, difficulty swallowing , nasal congestion. CV: Negative for chest pain, angina, palpitations, dyspnea on exertion, peripheral edema.  Respiratory: Negative for dyspnea at rest, dyspnea on exertion, cough, sputum, wheezing.  GI: See history of present illness. GU:  Negative  for dysuria, hematuria, urinary incontinence, urinary frequency, nocturnal urination.  Endo: Negative for unusual weight change.    Physical Examination:   BP 113/78   Pulse 85   Temp 97.9 F (36.6 C) (Oral)   Ht 5\' 1"  (1.549 m)   Wt 168 lb (76.2 kg)   BMI 31.74 kg/m   General: Well-nourished, well-developed in no acute distress.  Eyes: No icterus. Mouth: Oropharyngeal mucosa moist and pink , no lesions erythema or exudate. Lungs: Clear to auscultation  bilaterally.  Heart: Regular rate and rhythm, no murmurs rubs or gallops.  Abdomen: Bowel sounds are normal, nontender, nondistended, no hepatosplenomegaly or masses, no abdominal bruits or hernia , no rebound or guarding.   Extremities:  Left hand with mobile tubular mass, cyst like, dorsal aspect. No swelling noted.No lower extremity edema. No clubbing or deformities. Neuro: Alert and oriented x 4   Skin: Warm and dry, no jaundice.   Psych: Alert and cooperative, normal mood and affect.    Lab Results  Component Value Date   ALT 13 12/07/2015   AST 16 12/07/2015   ALKPHOS 97 12/07/2015   BILITOT 0.5 12/07/2015

## 2015-12-25 LAB — COMPREHENSIVE METABOLIC PANEL
ALT: 11 U/L (ref 6–29)
AST: 15 U/L (ref 10–35)
Albumin: 4.1 g/dL (ref 3.6–5.1)
Alkaline Phosphatase: 85 U/L (ref 33–130)
BUN: 16 mg/dL (ref 7–25)
CHLORIDE: 104 mmol/L (ref 98–110)
CO2: 26 mmol/L (ref 20–31)
CREATININE: 0.73 mg/dL (ref 0.50–1.05)
Calcium: 9.4 mg/dL (ref 8.6–10.4)
GLUCOSE: 72 mg/dL (ref 65–99)
Potassium: 4.3 mmol/L (ref 3.5–5.3)
SODIUM: 140 mmol/L (ref 135–146)
Total Bilirubin: 0.6 mg/dL (ref 0.2–1.2)
Total Protein: 7.2 g/dL (ref 6.1–8.1)

## 2015-12-25 LAB — CBC WITH DIFFERENTIAL/PLATELET
BASOS ABS: 0 {cells}/uL (ref 0–200)
Basophils Relative: 0 %
EOS ABS: 225 {cells}/uL (ref 15–500)
Eosinophils Relative: 3 %
HCT: 45.8 % — ABNORMAL HIGH (ref 35.0–45.0)
HEMOGLOBIN: 15.5 g/dL (ref 11.7–15.5)
LYMPHS PCT: 49 %
Lymphs Abs: 3675 cells/uL (ref 850–3900)
MCH: 32.4 pg (ref 27.0–33.0)
MCHC: 33.8 g/dL (ref 32.0–36.0)
MCV: 95.8 fL (ref 80.0–100.0)
MONO ABS: 750 {cells}/uL (ref 200–950)
MPV: 9.5 fL (ref 7.5–12.5)
Monocytes Relative: 10 %
NEUTROS PCT: 38 %
Neutro Abs: 2850 cells/uL (ref 1500–7800)
Platelets: 297 10*3/uL (ref 140–400)
RBC: 4.78 MIL/uL (ref 3.80–5.10)
RDW: 12.8 % (ref 11.0–15.0)
WBC: 7.5 10*3/uL (ref 3.8–10.8)

## 2015-12-25 NOTE — Assessment & Plan Note (Signed)
Doing well on harvoni. Complete 8 weeks of therapy. She is due for labs today. Plan to see her back in 2 months and at that time we'll consider colonoscopy. She will call in the interim with any questions or concerns.  Regarding tubular not on the dorsal side of her left hand, her concerns for possible blood clot, leg pain without swelling, I have asked that she see Dr. Legrand Rams today for this. She agrees and her 63s when to take her there from this office.

## 2015-12-25 NOTE — Progress Notes (Signed)
CC'ED TO PCP 

## 2015-12-26 LAB — HEPATITIS C RNA QUANTITATIVE: HCV Quantitative: NOT DETECTED IU/mL (ref <15)

## 2015-12-26 NOTE — Progress Notes (Signed)
Please let patient know that her labs look great! No detectable Hepatitis C! She needs to complete Harvoni as planned.  Keep ov in 02/2016, we will repeat labs at that time.

## 2015-12-27 ENCOUNTER — Other Ambulatory Visit: Payer: Self-pay | Admitting: Physician Assistant

## 2015-12-27 NOTE — Progress Notes (Signed)
PT is aware.

## 2015-12-29 NOTE — Progress Notes (Signed)
REVIEWED. HCV GENOTYPE 1A TREATMENT NAIVE-ELASTOGRAPHY F2/F3, HFP NL. HCV VL NEG. HEP B cAb POS, sAG NEG, sAB NEG. COMPLETE [place].

## 2016-01-16 ENCOUNTER — Telehealth: Payer: Self-pay

## 2016-01-16 NOTE — Telephone Encounter (Signed)
She can use Coricidin HBP, cold and flu formula as per package.  She can use Mucinex, plain version with just guafenisin in it.

## 2016-01-16 NOTE — Telephone Encounter (Signed)
PT is aware.

## 2016-01-16 NOTE — Telephone Encounter (Signed)
Pt called- left a voicemail- she is on Harvoni but she now has a "head cold" and wants to know what she can take for it. She said she is going to her pcp today. Routing to [facility]- just saw pt in the office on 12/24/15. This is a [facility] pt.

## 2016-01-17 ENCOUNTER — Other Ambulatory Visit: Payer: Self-pay | Admitting: Physician Assistant

## 2016-01-18 ENCOUNTER — Other Ambulatory Visit: Payer: Self-pay | Admitting: Physician Assistant

## 2016-01-21 ENCOUNTER — Other Ambulatory Visit: Payer: Self-pay | Admitting: Physician Assistant

## 2016-02-01 ENCOUNTER — Telehealth: Payer: Self-pay

## 2016-02-01 NOTE — Telephone Encounter (Signed)
HCV RNA negative on 12/24/15. She should have completed HCV treatment around 01/24/16. She is negative for now and highly unlikely that she relapses but sustained viral response (SVR) cannot be determined until 07/2015 (24 weeks after she completed HCV treatment).

## 2016-02-01 NOTE — Telephone Encounter (Signed)
T/C from [place] at Dr. Josephine Cables office. She said pt said that she has just completed treatment for the [place]. They are referring her to orthopedic and need confirmation that she has completed and is free of Hep C.

## 2016-02-04 NOTE — Telephone Encounter (Signed)
Benita is aware at Dr. Josephine Cables. I am faxing the note to her at [phone].

## 2016-02-08 ENCOUNTER — Other Ambulatory Visit (HOSPITAL_COMMUNITY): Payer: Self-pay | Admitting: Internal Medicine

## 2016-02-08 ENCOUNTER — Ambulatory Visit (HOSPITAL_COMMUNITY)
Admission: RE | Admit: 2016-02-08 | Discharge: 2016-02-08 | Disposition: A | Discharge status: Home / Self Care | Payer: Medicaid Other | Source: Ambulatory Visit | Attending: Internal Medicine | Admitting: Internal Medicine

## 2016-02-08 DIAGNOSIS — W19XXXA Unspecified fall, initial encounter: Secondary | ICD-10-CM

## 2016-02-08 DIAGNOSIS — M25511 Pain in right shoulder: Secondary | ICD-10-CM | POA: Insufficient documentation

## 2016-02-08 DIAGNOSIS — M19011 Primary osteoarthritis, right shoulder: Secondary | ICD-10-CM | POA: Diagnosis not present

## 2016-02-11 ENCOUNTER — Other Ambulatory Visit (HOSPITAL_COMMUNITY): Payer: Self-pay | Admitting: Internal Medicine

## 2016-02-11 DIAGNOSIS — R9389 Abnormal findings on diagnostic imaging of other specified body structures: Secondary | ICD-10-CM

## 2016-02-18 ENCOUNTER — Ambulatory Visit (HOSPITAL_COMMUNITY)
Admission: RE | Admit: 2016-02-18 | Discharge: 2016-02-18 | Disposition: A | Discharge status: Home / Self Care | Payer: Medicaid Other | Source: Ambulatory Visit | Attending: Internal Medicine | Admitting: Internal Medicine

## 2016-02-18 DIAGNOSIS — R938 Abnormal findings on diagnostic imaging of other specified body structures: Secondary | ICD-10-CM | POA: Diagnosis not present

## 2016-02-18 DIAGNOSIS — Z87891 Personal history of nicotine dependence: Secondary | ICD-10-CM | POA: Diagnosis not present

## 2016-02-18 DIAGNOSIS — Z780 Asymptomatic menopausal state: Secondary | ICD-10-CM | POA: Diagnosis not present

## 2016-02-18 DIAGNOSIS — R9389 Abnormal findings on diagnostic imaging of other specified body structures: Secondary | ICD-10-CM

## 2016-02-25 ENCOUNTER — Encounter: Payer: Self-pay | Admitting: Gastroenterology

## 2016-02-25 ENCOUNTER — Telehealth: Payer: Self-pay | Admitting: Gastroenterology

## 2016-02-25 ENCOUNTER — Ambulatory Visit: Payer: Medicaid Other | Admitting: Gastroenterology

## 2016-02-25 NOTE — Telephone Encounter (Signed)
Patient needs nonurgent ov.

## 2016-02-25 NOTE — Telephone Encounter (Signed)
RESCHEDULED APPOINTMENT AND MAILED LETTER

## 2016-02-25 NOTE — Telephone Encounter (Signed)
PATIENT WAS A NO SHOW AND LETTER SENT  °

## 2016-02-26 DIAGNOSIS — M25531 Pain in right wrist: Secondary | ICD-10-CM | POA: Insufficient documentation

## 2016-03-05 ENCOUNTER — Encounter: Payer: Self-pay | Admitting: Gastroenterology

## 2016-03-07 ENCOUNTER — Other Ambulatory Visit: Payer: Self-pay

## 2016-03-10 ENCOUNTER — Ambulatory Visit: Payer: Medicaid Other | Admitting: Gastroenterology

## 2016-03-27 ENCOUNTER — Encounter: Payer: Self-pay | Admitting: Gastroenterology

## 2016-03-27 ENCOUNTER — Ambulatory Visit (INDEPENDENT_AMBULATORY_CARE_PROVIDER_SITE_OTHER): Payer: Medicaid Other | Admitting: Gastroenterology

## 2016-03-27 VITALS — BP 108/74 | HR 95 | Temp 97.8°F | Ht 61.0 in | Wt 172.4 lb

## 2016-03-27 DIAGNOSIS — B182 Chronic viral hepatitis C: Secondary | ICD-10-CM

## 2016-03-27 DIAGNOSIS — K5903 Drug induced constipation: Secondary | ICD-10-CM

## 2016-03-27 DIAGNOSIS — K5900 Constipation, unspecified: Secondary | ICD-10-CM | POA: Insufficient documentation

## 2016-03-27 MED ORDER — LUBIPROSTONE 24 MCG PO CAPS: 24.0000 ug | ORAL_CAPSULE | Freq: Two times a day (BID) | ORAL | 5 refills | Status: DC

## 2016-03-27 NOTE — Assessment & Plan Note (Signed)
Start Amitiza 53mcg bid with food. May drop down to once daily if needed or call for dose adjustment.   Return to the office in 06/2016. At that time will plan on LFTs, HCV RNA quantitative and colonoscopy.

## 2016-03-27 NOTE — Progress Notes (Signed)
Primary Care Physician: Rosita Fire, MD  Primary Gastroenterologist:  Barney Drain, MD   Chief Complaint  Patient presents with  . Hepatitis C    HPI: [name] is a 51 y.o. female here for f/u Hep C. She was last seen in 12/2015. Completed 8 weeks of Harvoni mid 01/2016. 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   Doing well from GI standpoint. She does have History of hepatitis B core total antibody positive with negative surface antigen and negative surface antibody. LFTs remained stable at halfway point on Harvoni.   She had to have surgery on her right wrist for growth. This was 3 weeks ago. Currently on tramadol 50mg  bid. Stopped vicodin/oxycontin.   She is having some constipation. May go a week without bm. No melena, brbpr, abd pain, ugi sx. She wants to put off TCS until after her next OV.     Current Outpatient Prescriptions  Medication Sig Dispense Refill  . albuterol (PROVENTIL HFA;VENTOLIN HFA) 108 (90 BASE) MCG/ACT inhaler Inhale 2 puffs into the lungs every 6 (six) hours as needed. For shortness of breath     . DULoxetine (CYMBALTA) 60 MG capsule Take 60 mg by mouth 2 (two) times daily.     . Fluticasone-Salmeterol (ADVAIR DISKUS) 250-50 MCG/DOSE AEPB Inhale 1 puff into the lungs every 12 (twelve) hours.      . gabapentin (NEURONTIN) 600 MG tablet Take 1,200 mg by mouth 2 (two) times daily.      . hydrochlorothiazide (HYDRODIURIL) 25 MG tablet Take 25 mg by mouth daily.      Marland Kitchen levalbuterol (XOPENEX) 1.25 MG/3ML nebulizer solution Take 1.25 mg by nebulization every 4 (four) hours as needed for wheezing or shortness of breath.    . loratadine (CLARITIN) 10 MG tablet Take 10 mg by mouth daily.      Marland Kitchen omeprazole (PRILOSEC) 20 MG capsule Take 1 capsule (20 mg total) by mouth daily. 90 capsule 3  . potassium chloride (K-DUR) 10 MEQ tablet Take 20 mEq by mouth 2 (two) times daily.     . TRAMADOL HCL ER PO Take 50 mg by mouth 2 (two) times daily.      Marland Kitchen Umeclidinium Bromide (INCRUSE ELLIPTA) 62.5 MCG/INH AEPB Inhale 1 puff into the lungs daily.     No current facility-administered medications for this visit.     Allergies as of 03/27/2016 - Review Complete 03/27/2016  Allergen Reaction Noted  . Cephalexin Shortness Of Breath 01/10/2011  . Abilify [aripiprazole] Nausea And Vomiting 11/04/2014  . Augmentin [amoxicillin-pot clavulanate] Nausea And Vomiting 09/25/2012  . Penicillins Nausea And Vomiting 09/25/2012  . Seroquel [quetiapine fumarate]  11/04/2014  . Aspirin Swelling and Rash 09/25/2012  . Sulfa antibiotics Rash and Other (See Comments) 09/25/2012   Past Medical History:  Diagnosis Date  . Asthma   . Bipolar disease, chronic ([place])   . Cancer (HCC)    cervical cancer. partial hysterectomy in 20s.   . Cervical cancer ([place])   . COPD (chronic obstructive pulmonary disease) ([place])   . DDD (degenerative disc disease)   . Diabetes mellitus    not on medication  . Factor 5 Leiden mutation, heterozygous ([place])   . Hep C w/o coma, chronic ([place])   . Hypertension   . 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 Pneumonia   . Seizures ([place])    Past Surgical History:  Procedure Laterality Date  . ABDOMINAL HYSTERECTOMY     cervical cancer  .  BACK SURGERY    . carpel tunnel    . COLONOSCOPY  20 yrs      Family History  Problem Relation Age of Onset  . Diabetes Mother   . Cancer Mother     breast   . Cancer Other   . Colon cancer Neg Hx   . Liver disease Neg Hx    Social History   Social History  . Marital status: Single    Spouse name: N/A  . Number of children: N/A  . Years of education: N/A   Social History Main Topics  . Smoking status: Current Every Day Smoker    Packs/day: 1.00    Years: 37.00    Types: Cigarettes  . Smokeless tobacco: Never Used  . Alcohol use No  . Drug use: Yes    Types: Marijuana     Comment: remote IV drugs. last intranasal drugs 04/2015  . Sexual  activity: Not Asked   Other Topics Concern  . None   Social History Narrative  . None    ROS:  General: Negative for anorexia, weight loss, fever, chills, fatigue, weakness. ENT: Negative for hoarseness, difficulty swallowing , nasal congestion. CV: Negative for chest pain, angina, palpitations, dyspnea on exertion, peripheral edema.  Respiratory: Negative for dyspnea at rest, dyspnea on exertion, cough, sputum, wheezing.  GI: See history of present illness. GU:  Negative for dysuria, hematuria, urinary incontinence, urinary frequency, nocturnal urination.  Endo: Negative for unusual weight change.    Physical Examination:   BP 108/74   Pulse 95   Temp 97.8 F (36.6 C) (Oral)   Ht 5\' 1"  (1.549 m)   Wt 172 lb 6.4 oz (78.2 kg)   BMI 32.57 kg/m   General: Well-nourished, well-developed in no acute distress.  Eyes: No icterus. Mouth: Oropharyngeal mucosa moist and pink , no lesions erythema or exudate. Lungs: Clear to auscultation bilaterally.  Heart: Regular rate and rhythm, no murmurs rubs or gallops.  Abdomen: Bowel sounds are normal, nontender, nondistended, no hepatosplenomegaly or masses, no abdominal bruits or hernia , no rebound or guarding.   Extremities: No lower extremity edema. No clubbing or deformities. Neuro: Alert and oriented x 4   Skin: Warm and dry, no jaundice.   Psych: Alert and cooperative, normal mood and affect.

## 2016-03-27 NOTE — Patient Instructions (Signed)
1. Start Amitiza 58mcg once to twice daily with food for constipation. Let me know if we need to adjust the dose at all. Prescription sent to your pharmacy.  2. We will see you back in 3 months and do you labs at that time. Also plan on a colonoscopy at that time.  3. Tramadol is ok as long as you don't take the tramadol that has acetaminophen (Tylenol) with it.  4. Call with any questions in the meantime.

## 2016-03-27 NOTE — Assessment & Plan Note (Signed)
Completed 8 weeks of Harvoni in mid January 2018. She is due for post treatment HCV RNA, LFTs. Will also plan on 24 weeks labs when she returns 06/2016.

## 2016-03-27 NOTE — Progress Notes (Signed)
cc'ed to pcp °

## 2016-03-31 ENCOUNTER — Ambulatory Visit: Payer: Medicaid Other | Admitting: Gastroenterology

## 2016-04-04 LAB — HEPATIC FUNCTION PANEL
ALT: 11 U/L (ref 6–29)
AST: 13 U/L (ref 10–35)
Albumin: 3.8 g/dL (ref 3.6–5.1)
Alkaline Phosphatase: 91 U/L (ref 33–130)
Bilirubin, Direct: 0.1 mg/dL (ref <=0.2)
Indirect Bilirubin: 0.4 mg/dL (ref 0.2–1.2)
Total Bilirubin: 0.5 mg/dL (ref 0.2–1.2)
Total Protein: 6.8 g/dL (ref 6.1–8.1)

## 2016-04-07 LAB — HEPATITIS C RNA QUANTITATIVE
HCV QUANT LOG: NOT DETECTED {Log_IU}/mL
HCV QUANT: NOT DETECTED [IU]/mL

## 2016-04-16 ENCOUNTER — Other Ambulatory Visit: Payer: Self-pay | Admitting: Physician Assistant

## 2016-04-16 ENCOUNTER — Other Ambulatory Visit: Payer: Self-pay

## 2016-04-16 DIAGNOSIS — B1920 Unspecified viral hepatitis C without hepatic coma: Secondary | ICD-10-CM

## 2016-04-16 NOTE — Progress Notes (Signed)
lfts normal. HCV RNA negative post treatment.  She will be having ov in 06/2016 colonoscopy. She needs to have LFTs, HCV RNA quantitative 2 days prior to her office visit June 2018.

## 2016-04-16 NOTE — Progress Notes (Signed)
PT is aware and lab orders are on file for 06/25/2016.

## 2016-04-22 NOTE — Progress Notes (Signed)
REVIEWED-NO ADDITIONAL RECOMMENDATIONS. 

## 2016-04-27 ENCOUNTER — Emergency Department (HOSPITAL_COMMUNITY): Payer: Medicaid Other

## 2016-04-27 ENCOUNTER — Encounter (HOSPITAL_COMMUNITY): Payer: Self-pay | Admitting: Emergency Medicine

## 2016-04-27 ENCOUNTER — Emergency Department (HOSPITAL_COMMUNITY)
Admission: EM | Admit: 2016-04-27 | Discharge: 2016-04-27 | Disposition: A | Discharge status: Home / Self Care | Payer: Medicaid Other | Attending: Emergency Medicine | Admitting: Emergency Medicine

## 2016-04-27 DIAGNOSIS — Y939 Activity, unspecified: Secondary | ICD-10-CM | POA: Insufficient documentation

## 2016-04-27 DIAGNOSIS — E119 Type 2 diabetes mellitus without complications: Secondary | ICD-10-CM | POA: Insufficient documentation

## 2016-04-27 DIAGNOSIS — J449 Chronic obstructive pulmonary disease, unspecified: Secondary | ICD-10-CM | POA: Diagnosis not present

## 2016-04-27 DIAGNOSIS — F17210 Nicotine dependence, cigarettes, uncomplicated: Secondary | ICD-10-CM | POA: Insufficient documentation

## 2016-04-27 DIAGNOSIS — S20212A Contusion of left front wall of thorax, initial encounter: Secondary | ICD-10-CM | POA: Diagnosis not present

## 2016-04-27 DIAGNOSIS — W1839XA Other fall on same level, initial encounter: Secondary | ICD-10-CM | POA: Diagnosis not present

## 2016-04-27 DIAGNOSIS — S0990XA Unspecified injury of head, initial encounter: Secondary | ICD-10-CM | POA: Diagnosis present

## 2016-04-27 DIAGNOSIS — H1132 Conjunctival hemorrhage, left eye: Secondary | ICD-10-CM | POA: Insufficient documentation

## 2016-04-27 DIAGNOSIS — J45909 Unspecified asthma, uncomplicated: Secondary | ICD-10-CM | POA: Diagnosis not present

## 2016-04-27 DIAGNOSIS — I10 Essential (primary) hypertension: Secondary | ICD-10-CM | POA: Diagnosis not present

## 2016-04-27 DIAGNOSIS — Z79899 Other long term (current) drug therapy: Secondary | ICD-10-CM | POA: Insufficient documentation

## 2016-04-27 DIAGNOSIS — Y999 Unspecified external cause status: Secondary | ICD-10-CM | POA: Insufficient documentation

## 2016-04-27 DIAGNOSIS — W19XXXA Unspecified fall, initial encounter: Secondary | ICD-10-CM

## 2016-04-27 DIAGNOSIS — Y929 Unspecified place or not applicable: Secondary | ICD-10-CM | POA: Diagnosis not present

## 2016-04-27 MED ORDER — FLUORESCEIN SODIUM 0.6 MG OP STRP
1.0000 | ORAL_STRIP | Freq: Once | OPHTHALMIC | Status: DC
  Filled 2016-04-27: qty 1

## 2016-04-27 MED ORDER — CYCLOBENZAPRINE HCL 10 MG PO TABS: 10.0000 mg | ORAL_TABLET | Freq: Three times a day (TID) | ORAL | 0 refills | Status: DC | PRN

## 2016-04-27 NOTE — Discharge Instructions (Signed)
Take the prescription as directed.  Continue to take your usual prescriptions as previously directed. Apply moist heat or ice to the area(s) of discomfort, for 15 minutes at a time, several times per day for the next few days.  Do not fall asleep on a heating or ice pack.  Call your regular medical doctor tomorrow to schedule a follow up appointment in the next 3 days.  Return to the Emergency Department immediately if worsening.

## 2016-04-27 NOTE — ED Notes (Signed)
From radiology 

## 2016-04-27 NOTE — ED Notes (Signed)
Continues in radiology 

## 2016-04-27 NOTE — ED Provider Notes (Signed)
[name] DEPT Provider Note   CSN: [id number] Arrival date & time: 04/27/16  1514     History   Chief Complaint Chief Complaint  Patient presents with  . Fall    HPI [name] is a 51 y.o. female.  HPI  Pt was seen at 1530. Per pt, c/o gradual onset and persistence of constant left ribs "pain" that began after she fell yesterday. Pt states her granddaughter fell and she ran to get her, falling to her left side onto the ground. Pt c/o "there was blood on the white part of my left eye" and her left ribs "hurt." Ribs pain worsens with deep breath and palpation of the area. Denies eye pain, no visual changes, no open wounds, no palpitations, no SOB/cough, no neck or back pain, no syncope/LOC, no AMS, no focal motor weakness, no tingling/numbness in extremities, no abd pain, no N/V/D.   Past Medical History:  Diagnosis Date  . Asthma   . Bipolar disease, chronic ([place])   . Cancer (HCC)    cervical cancer. partial hysterectomy in 20s.   . Cervical cancer ([place])   . COPD (chronic obstructive pulmonary disease) ([place])   . DDD (degenerative disc disease)   . Diabetes mellitus    not on medication  . Factor 5 Leiden mutation, heterozygous ([place])   . Hep C w/o coma, chronic ([place])   . Hypertension   . 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 Pneumonia   . Seizures [facility])     Patient Active Problem List   Diagnosis Date Noted  . Constipation 03/27/2016    Past Surgical History:  Procedure Laterality Date  . ABDOMINAL HYSTERECTOMY     cervical cancer  . BACK SURGERY    . carpel tunnel    . COLONOSCOPY  20 yrs   [place]  . MASS EXCISION      OB History    No data available       Home Medications    Prior to Admission medications   Medication Sig Start Date End Date Taking? Authorizing Provider  albuterol (PROVENTIL HFA;VENTOLIN HFA) 108 (90 BASE) MCG/ACT inhaler Inhale 2 puffs into the lungs every 6 (six) hours as needed. For  shortness of breath     Historical Provider, MD  DULoxetine (CYMBALTA) 60 MG capsule Take 60 mg by mouth 2 (two) times daily.     Historical Provider, MD  Fluticasone-Salmeterol (ADVAIR DISKUS) 250-50 MCG/DOSE AEPB Inhale 1 puff into the lungs every 12 (twelve) hours.      Historical Provider, MD  gabapentin (NEURONTIN) 600 MG tablet Take 1,200 mg by mouth 2 (two) times daily.      Historical Provider, MD  hydrochlorothiazide (HYDRODIURIL) 25 MG tablet Take 25 mg by mouth daily.      Historical Provider, MD  levalbuterol Penne Lash) 1.25 MG/3ML nebulizer solution Take 1.25 mg by nebulization every 4 (four) hours as needed for wheezing or shortness of breath.    Historical Provider, MD  loratadine (CLARITIN) 10 MG tablet Take 10 mg by mouth daily.      Historical Provider, MD  lubiprostone (AMITIZA) 24 MCG capsule Take 1 capsule (24 mcg total) by mouth 2 (two) times daily with a meal. 03/27/16   Mahala Menghini, PA-C  omeprazole (PRILOSEC) 20 MG capsule Take 1 capsule (20 mg total) by mouth daily. 10/09/15   Mahala Menghini, PA-C  potassium chloride (K-DUR) 10 MEQ tablet Take 20 mEq by mouth 2 (two) times daily.  Historical Provider, MD  TRAMADOL HCL ER PO Take 50 mg by mouth 2 (two) times daily.    Historical Provider, MD  Umeclidinium Bromide (INCRUSE ELLIPTA) 62.5 MCG/INH AEPB Inhale 1 puff into the lungs daily.    Historical Provider, MD    Family History Family History  Problem Relation Age of Onset  . Diabetes Mother   . Cancer Mother     breast   . Cancer Other   . Colon cancer Neg Hx   . Liver disease Neg Hx     Social History Social History  Substance Use Topics  . Smoking status: Current Every Day Smoker    Packs/day: 1.00    Years: 37.00    Types: Cigarettes  . Smokeless tobacco: Never Used  . Alcohol use No     Allergies   Cephalexin; Abilify [aripiprazole]; Augmentin [amoxicillin-pot clavulanate]; Penicillins; Seroquel [quetiapine fumarate]; Aspirin; and Sulfa  antibiotics   Review of Systems Review of Systems ROS: Statement: All systems negative except as marked or noted in the HPI; Constitutional: Negative for fever and chills. ; ; Eyes: +left eye redness. Negative for eye pain and discharge. ; ; ENMT: Negative for ear pain, hoarseness, nasal congestion, sinus pressure and sore throat. ; ; Cardiovascular: Negative for chest pain, palpitations, diaphoresis, dyspnea and peripheral edema. ; ; Respiratory: Negative for cough, wheezing and stridor. ; ; Gastrointestinal: Negative for nausea, vomiting, diarrhea, abdominal pain, blood in stool, hematemesis, jaundice and rectal bleeding. . ; ; Genitourinary: Negative for dysuria, flank pain and hematuria. ; ; Musculoskeletal: +chest wall pain. Negative for back pain and neck pain. Negative for swelling and deformity.; ; Skin: Negative for pruritus, rash, abrasions, blisters, bruising and skin lesion.; ; Neuro: Negative for headache, lightheadedness and neck stiffness. Negative for weakness, altered level of consciousness, altered mental status, extremity weakness, paresthesias, involuntary movement, seizure and syncope.       Physical Exam Updated Vital Signs BP (!) 157/78 (BP Location: Right Arm)   Pulse 89   Temp 97.9 F (36.6 C) (Oral)   Resp 20   Ht 5\' 1"  (1.549 m)   Wt 172 lb (78 kg)   SpO2 100%   BMI 32.50 kg/m     16:44:50 ED Notes MT  Visual accuity  OD-20/20 OS-20/25 OU- 20/25    Physical Exam 1535: Physical examination: Vital signs and O2 SAT: Reviewed; Constitutional: Well developed, Well nourished, Well hydrated, In no acute distress; Head and Face: Normocephalic, Atraumatic. Facial bones NT to palp. No facial abrasions, ecchymosis or edema.; Eyes: EOMI, PERRL, No scleral icterus; Eye Exam: Right pupil: Size: 3 mm; Findings: Normal, Briskly reactive; Left pupil: Size: 3 mm; Findings: Normal, Briskly reactive; Extraocular movement: Bilateral normal, No nystagmus. ; Eyelid: Bilateral  normal, No edema.  No ptosis.  Left upper and lower eyelids everted for exam, no FB identified. ; Conjunctiva and sclera:  +left small subconjunctival hemorrhage, no chemosis, no discharge.  No obvious hyphema or hypopion.  ; Cornea and anterior chamber: Left normal, Anterior chamber without cell and flair.  Fluorescein stain left eye: negative for corneal abrasion, no corneal ulcer, neg Seidel's.; Diagnostic medications: Left fluorescein, Left proparacaine; Diagnostic instrument: Ophthalmoscope, Slit lamp, Wood's lamp; Visual acuity right: 20/ 20; Visual acuity left: 20/25. ;;  ENMT: Mouth and pharynx normal, Left TM normal, Right TM normal, Mucous membranes moist; Neck: Immobilized in Supple, Trachea midline; Spine: No midline CS, TS, LS tenderness.; Cardiovascular: Regular rate and rhythm, No gallop; Respiratory: Breath sounds clear & equal bilaterally,  No wheezes, Normal respiratory effort/excursion; Chest: +left lateral ribs tender to palp. No deformity, Movement normal, No crepitus, No abrasions or ecchymosis.; Abdomen: Soft, Nontender, Nondistended, Normal bowel sounds, No abrasions or ecchymosis.; Genitourinary: No CVA tenderness;; Extremities: No deformity, Full range of motion major/large joints of bilat UE's and LE's without pain or tenderness to palp, Neurovascularly intact, Pulses normal, No tenderness, No edema, Pelvis stable; Neuro: AA&Ox3, GCS 15.  Major CN grossly intact. Speech clear. No gross focal motor or sensory deficits in extremities. Climbs on and off stretcher easily by herself. Gait steady.; Skin: Color normal, Warm, Dry   ED Treatments / Results  Labs (all labs ordered are listed, but only abnormal results are displayed)   EKG  EKG Interpretation None       Radiology   Procedures Procedures (including critical care time)  Medications Ordered in ED Medications  fluorescein ophthalmic strip 1 strip (not administered)     Initial Impression / Assessment and Plan /  ED Course  I have reviewed the triage vital signs and the nursing notes.  Pertinent labs & imaging results that were available during my care of the patient were reviewed by me and considered in my medical decision making (see chart for details).  MDM Reviewed: previous chart, nursing note and vitals Interpretation: x-ray and CT scan    Dg Ribs Unilateral W/chest Left Result Date: 04/27/2016 CLINICAL DATA:  Left chest pain due to a fall yesterday. Initial encounter. EXAM: LEFT RIBS AND CHEST - 3+ VIEW COMPARISON:  Plain films chest and left ribs 05/22/2015. FINDINGS: The lungs are clear. No pneumothorax or pleural fluid. Heart size is normal. No rib fracture. IMPRESSION: No acute disease.  Negative for fracture. Electronically Signed   By: Inge Rise M.D.   On: 04/27/2016 15:47   Ct Head Wo Contrast Result Date: 04/27/2016 CLINICAL DATA:  Blurry vision in the left eye after fall yesterday. EXAM: CT HEAD WITHOUT CONTRAST CT MAXILLOFACIAL WITHOUT CONTRAST TECHNIQUE: Multidetector CT imaging of the head and maxillofacial structures were performed using the standard protocol without intravenous contrast. Multiplanar CT image reconstructions of the maxillofacial structures were also generated. COMPARISON:  Head CT 12/04/2006 FINDINGS: CT HEAD FINDINGS Brain: There is no evidence for acute hemorrhage, hydrocephalus, mass lesion, or abnormal extra-axial fluid collection. No definite CT evidence for acute infarction. Vascular: No hyperdense vessel or unexpected calcification. Skull: No evidence for fracture. No worrisome lytic or sclerotic lesion. Sinuses/Orbits: The visualized paranasal sinuses and mastoid air cells are clear. Other: Visualized portions of the globes and intraorbital fat are unremarkable. CT MAXILLOFACIAL FINDINGS Osseous: No fracture or mandibular dislocation. No destructive process. Orbits: Negative. No traumatic or inflammatory finding. Sinuses: Trace mucosal disease left maxillary  sinus. Otherwise visualized paranasal sinuses are clear. Soft tissues: Negative. IMPRESSION: 1. Normal CT scan of the brain. 2. No acute facial bone fracture. 3. No gross abnormality involving the left orbit. Electronically Signed   By: Misty Stanley M.D.   On: 04/27/2016 16:46   Ct Maxillofacial Wo Cm Result Date: 04/27/2016 CLINICAL DATA:  Blurry vision in the left eye after fall yesterday. EXAM: CT HEAD WITHOUT CONTRAST CT MAXILLOFACIAL WITHOUT CONTRAST TECHNIQUE: Multidetector CT imaging of the head and maxillofacial structures were performed using the standard protocol without intravenous contrast. Multiplanar CT image reconstructions of the maxillofacial structures were also generated. COMPARISON:  Head CT 12/04/2006 FINDINGS: CT HEAD FINDINGS Brain: There is no evidence for acute hemorrhage, hydrocephalus, mass lesion, or abnormal extra-axial fluid collection. No definite CT evidence  for acute infarction. Vascular: No hyperdense vessel or unexpected calcification. Skull: No evidence for fracture. No worrisome lytic or sclerotic lesion. Sinuses/Orbits: The visualized paranasal sinuses and mastoid air cells are clear. Other: Visualized portions of the globes and intraorbital fat are unremarkable. CT MAXILLOFACIAL FINDINGS Osseous: No fracture or mandibular dislocation. No destructive process. Orbits: Negative. No traumatic or inflammatory finding. Sinuses: Trace mucosal disease left maxillary sinus. Otherwise visualized paranasal sinuses are clear. Soft tissues: Negative. IMPRESSION: 1. Normal CT scan of the brain. 2. No acute facial bone fracture. 3. No gross abnormality involving the left orbit. Electronically Signed   By: Misty Stanley M.D.   On: 04/27/2016 16:46    1715:  Workup reassuring. Tx symptomatically at this time. Dx and testing d/w pt.  Questions answered.  Verb understanding, agreeable to d/c home with outpt f/u.   Final Clinical Impressions(s) / ED Diagnoses   Final diagnoses:  None      New Prescriptions New Prescriptions   No medications on file     Francine Graven, DO 04/30/16 1456

## 2016-04-27 NOTE — ED Notes (Signed)
To radiology

## 2016-04-27 NOTE — ED Triage Notes (Signed)
Patient c/o blurred vision in left eye and left rib pain after fall yesterday. Patient states granddaughter fell and she ran to get her but fell down face first on the ground. Patient states left eye was bleeding, no active bleeding at this time. Denies any pain in left eye. Patient also states increased pain with deep breath. Denies LOC or dizziness.

## 2016-04-27 NOTE — ED Notes (Signed)
Visual accuity  OD-20/20 OS-20/25 OU- 20/25

## 2016-04-27 NOTE — ED Notes (Signed)
Pt states fall yesterday face first- no loc- here due to eye/vision px as well as l chest pain

## 2016-04-29 ENCOUNTER — Other Ambulatory Visit: Payer: Self-pay | Admitting: Physician Assistant

## 2016-05-05 ENCOUNTER — Encounter (HOSPITAL_COMMUNITY): Payer: Self-pay

## 2016-05-05 ENCOUNTER — Emergency Department (HOSPITAL_COMMUNITY)
Admission: EM | Admit: 2016-05-05 | Discharge: 2016-05-05 | Disposition: A | Discharge status: Home / Self Care | Payer: Medicaid Other | Attending: Emergency Medicine | Admitting: Emergency Medicine

## 2016-05-05 DIAGNOSIS — I10 Essential (primary) hypertension: Secondary | ICD-10-CM | POA: Diagnosis not present

## 2016-05-05 DIAGNOSIS — J45909 Unspecified asthma, uncomplicated: Secondary | ICD-10-CM | POA: Insufficient documentation

## 2016-05-05 DIAGNOSIS — F17210 Nicotine dependence, cigarettes, uncomplicated: Secondary | ICD-10-CM | POA: Insufficient documentation

## 2016-05-05 DIAGNOSIS — N611 Abscess of the breast and nipple: Secondary | ICD-10-CM | POA: Diagnosis present

## 2016-05-05 DIAGNOSIS — E119 Type 2 diabetes mellitus without complications: Secondary | ICD-10-CM | POA: Diagnosis not present

## 2016-05-05 DIAGNOSIS — Z8614 Personal history of Methicillin resistant Staphylococcus aureus infection: Secondary | ICD-10-CM

## 2016-05-05 DIAGNOSIS — Z79899 Other long term (current) drug therapy: Secondary | ICD-10-CM | POA: Diagnosis not present

## 2016-05-05 DIAGNOSIS — J449 Chronic obstructive pulmonary disease, unspecified: Secondary | ICD-10-CM | POA: Diagnosis not present

## 2016-05-05 DIAGNOSIS — L089 Local infection of the skin and subcutaneous tissue, unspecified: Secondary | ICD-10-CM | POA: Diagnosis not present

## 2016-05-05 MED ORDER — DOXYCYCLINE HYCLATE 100 MG PO TABS
100.0000 mg | ORAL_TABLET | Freq: Once | ORAL | Status: AC
  Administered 2016-05-05: 100 mg via ORAL
  Filled 2016-05-05: qty 1

## 2016-05-05 MED ORDER — DOXYCYCLINE HYCLATE 100 MG PO CAPS: 100.0000 mg | ORAL_CAPSULE | Freq: Two times a day (BID) | ORAL | 0 refills | Status: DC

## 2016-05-05 NOTE — ED Provider Notes (Signed)
[facility] DEPT Provider Note   CSN: [id number] Arrival date & time: 05/05/16  1123  By signing my name below, I, Collene Leyden, attest that this documentation has been prepared under the direction and in the presence of Evalee Jefferson, PA-C. Electronically Signed: Collene Leyden, Scribe. 05/05/16. 1:31 PM.  History   Chief Complaint Chief Complaint  Patient presents with  . Abscess    HPI Comments: [name] is a 51 y.o. female with a history of MRSA, who presents to the Emergency Department complaining of sudden-onset area of erythema under the left breast that appeared several days ago. Patient states when she fell one week ago she scratched underneath her left breast. Patient states the scratch under her left breast has now developed into and "abscess" with an open yellow area. Patient reports associated pain to the site. She is applying a triple antibiotic cream and mupirocin 2% to the area with no relief. Patient also reports cleaning the area with hydrogen peroxide and applying a band-aid. Patient denies any fever, nausea, vomiting, abdominal pain, drainage, or any other symptoms.  The history is provided by the patient. No language interpreter was used.    Past Medical History:  Diagnosis Date  . Asthma   . Bipolar disease, chronic ([place])   . Cancer (HCC)    cervical cancer. partial hysterectomy in 20s.   . Cervical cancer ([place])   . COPD (chronic obstructive pulmonary disease) ([place])   . DDD (degenerative disc disease)   . Diabetes mellitus    not on medication  . Factor 5 Leiden mutation, heterozygous ([place])   . Hep C w/o coma, chronic ([place])   . Hypertension   . 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 Pneumonia   . Seizures [facility])     Patient Active Problem List   Diagnosis Date Noted  . Constipation 03/27/2016    Past Surgical History:  Procedure Laterality Date  . ABDOMINAL HYSTERECTOMY     cervical cancer  . BACK SURGERY    .  carpel tunnel    . COLONOSCOPY  20 yrs   [place]  . MASS EXCISION      OB History    No data available       Home Medications    Prior to Admission medications   Medication Sig Start Date End Date Taking? Authorizing Provider  albuterol (PROVENTIL HFA;VENTOLIN HFA) 108 (90 BASE) MCG/ACT inhaler Inhale 2 puffs into the lungs every 6 (six) hours as needed. For shortness of breath     Historical Provider, MD  beclomethasone (QVAR) 40 MCG/ACT inhaler Inhale 2 puffs into the lungs 2 (two) times daily.    Historical Provider, MD  cyclobenzaprine (FLEXERIL) 10 MG tablet Take 1 tablet (10 mg total) by mouth 3 (three) times daily as needed for muscle spasms. 04/27/16   Francine Graven, DO  doxycycline (VIBRAMYCIN) 100 MG capsule Take 1 capsule (100 mg total) by mouth 2 (two) times daily. 05/05/16   Evalee Jefferson, PA-C  DULoxetine (CYMBALTA) 60 MG capsule Take 60 mg by mouth 2 (two) times daily.     Historical Provider, MD  Fluticasone-Salmeterol (ADVAIR DISKUS) 250-50 MCG/DOSE AEPB Inhale 1 puff into the lungs every 12 (twelve) hours.      Historical Provider, MD  gabapentin (NEURONTIN) 600 MG tablet Take 1,200 mg by mouth 2 (two) times daily.      Historical Provider, MD  hydrochlorothiazide (HYDRODIURIL) 25 MG tablet Take 25 mg by mouth daily.  Historical Provider, MD  levalbuterol Penne Lash) 1.25 MG/3ML nebulizer solution Take 1.25 mg by nebulization every 4 (four) hours as needed for wheezing or shortness of breath.    Historical Provider, MD  loratadine (CLARITIN) 10 MG tablet Take 10 mg by mouth daily.      Historical Provider, MD  lubiprostone (AMITIZA) 24 MCG capsule Take 1 capsule (24 mcg total) by mouth 2 (two) times daily with a meal. 03/27/16   Mahala Menghini, PA-C  omeprazole (PRILOSEC) 20 MG capsule Take 1 capsule (20 mg total) by mouth daily. 10/09/15   Mahala Menghini, PA-C  potassium chloride (K-DUR) 10 MEQ tablet Take 20 mEq by mouth 2 (two) times daily.     Historical Provider,  MD  TRAMADOL HCL ER PO Take 50 mg by mouth 2 (two) times daily.    Historical Provider, MD    Family History Family History  Problem Relation Age of Onset  . Diabetes Mother   . Cancer Mother     breast   . Cancer Other   . Colon cancer Neg Hx   . Liver disease Neg Hx     Social History Social History  Substance Use Topics  . Smoking status: Current Every Day Smoker    Packs/day: 1.00    Years: 37.00    Types: Cigarettes  . Smokeless tobacco: Never Used  . Alcohol use No     Allergies   Cephalexin; Abilify [aripiprazole]; Augmentin [amoxicillin-pot clavulanate]; Penicillins; Seroquel [quetiapine fumarate]; Aspirin; and Sulfa antibiotics   Review of Systems Review of Systems  Constitutional: Negative for chills and fever.  Gastrointestinal: Negative for abdominal pain, diarrhea, nausea and vomiting.  Skin: Positive for color change.       "abscess" below the left breast.      Physical Exam Updated Vital Signs BP (!) 145/103   Pulse 85   Temp 98.4 F (36.9 C) (Oral)   Resp (!) 85   Ht 5\' 1"  (1.549 m)   Wt 78 kg   SpO2 98%   BMI 32.50 kg/m   Physical Exam  Constitutional: She is oriented to person, place, and time. She appears well-developed.  HENT:  Head: Normocephalic and atraumatic.  Mouth/Throat: Oropharynx is clear and moist.  Eyes: Conjunctivae and EOM are normal. Pupils are equal, round, and reactive to light.  Neck: Normal range of motion. Neck supple.  Cardiovascular: Normal rate.   Pulmonary/Chest: Effort normal.  Abdominal: Soft. Bowel sounds are normal.  Musculoskeletal: Normal range of motion.  Neurological: She is alert and oriented to person, place, and time.  Skin: Skin is warm and dry. There is erythema.  Quarter sized superficial area of erythema at her inferior left breast with a central wet green colored exudate. Theres no deep palpable abscess or induration. Infection appears very superficial. No red streaking. There is one tiny  pustule two centimeters away from the main site.   Psychiatric: She has a normal mood and affect.  Nursing note and vitals reviewed.    ED Treatments / Results  DIAGNOSTIC STUDIES: Oxygen Saturation is 98% on RA, normal by my interpretation.    COORDINATION OF CARE: 1:26 PM Discussed treatment plan with pt at bedside and pt agreed to plan, which includes antibiotics.   Labs (all labs ordered are listed, but only abnormal results are displayed) Labs Reviewed - No data to display  EKG  EKG Interpretation None       Radiology No results found.  Procedures Procedures (including critical care time)  Medications Ordered in ED Medications  doxycycline (VIBRA-TABS) tablet 100 mg (100 mg Oral Given 05/05/16 1342)     Initial Impression / Assessment and Plan / ED Course  I have reviewed the triage vital signs and the nursing notes.  Pertinent labs & imaging results that were available during my care of the patient were reviewed by me and considered in my medical decision making (see chart for details).     Pt advised to continue mupirocin, will add doxycycline given h/o mrsa.  Prn f/u with pcp if not resolving with tx.  Of note, pt was not tachypneic during exam.  Respiratory rate documented by RN in error.  Final Clinical Impressions(s) / ED Diagnoses   Final diagnoses:  Skin infection  History of MRSA infection    New Prescriptions Discharge Medication List as of 05/05/2016  1:34 PM    START taking these medications   Details  doxycycline (VIBRAMYCIN) 100 MG capsule Take 1 capsule (100 mg total) by mouth 2 (two) times daily., Starting Mon 05/05/2016, Print       I personally performed the services described in this documentation, which was scribed in my presence. The recorded information has been reviewed and is accurate.    Evalee Jefferson, PA-C 05/08/16 1109    Julianne Rice, MD 05/15/16 [phone]

## 2016-05-05 NOTE — ED Triage Notes (Signed)
Pt was seen on 04/27/16 for a fall. At that point she had a small scratch under left breast, now has an open yellow area and states her lungs hurt. She called Dr Legrand Rams and was told to come her due to her hx of MRSA that "spread to brain"

## 2016-05-05 NOTE — Discharge Instructions (Signed)
Continue applying your mupirocin ointment twice daily after a soap and water wash of the site.  Taking next dose of doxycycline this evening.  Follow-up with your primary doctor or return here for any persistent or worsening symptoms.

## 2016-05-05 NOTE — ED Notes (Signed)
Quarter size red area under left breast with drainage

## 2016-05-26 ENCOUNTER — Other Ambulatory Visit: Payer: Self-pay

## 2016-05-26 DIAGNOSIS — B1920 Unspecified viral hepatitis C without hepatic coma: Secondary | ICD-10-CM

## 2016-06-30 ENCOUNTER — Telehealth: Payer: Self-pay | Admitting: Gastroenterology

## 2016-06-30 ENCOUNTER — Ambulatory Visit: Payer: Medicaid Other | Admitting: Gastroenterology

## 2016-06-30 ENCOUNTER — Encounter: Payer: Self-pay | Admitting: Gastroenterology

## 2016-06-30 NOTE — Telephone Encounter (Signed)
PATIENT WAS A NO SHOW AND LETTER SENT  °

## 2016-08-22 ENCOUNTER — Other Ambulatory Visit: Payer: Self-pay | Admitting: Physician Assistant

## 2016-08-29 ENCOUNTER — Other Ambulatory Visit (HOSPITAL_COMMUNITY): Payer: Self-pay | Admitting: Internal Medicine

## 2016-08-29 ENCOUNTER — Ambulatory Visit (HOSPITAL_COMMUNITY)
Admission: RE | Admit: 2016-08-29 | Discharge: 2016-08-29 | Disposition: A | Discharge status: Home / Self Care | Payer: Medicaid Other | Source: Ambulatory Visit | Attending: Internal Medicine | Admitting: Internal Medicine

## 2016-08-29 DIAGNOSIS — J40 Bronchitis, not specified as acute or chronic: Secondary | ICD-10-CM | POA: Diagnosis present

## 2016-09-17 ENCOUNTER — Ambulatory Visit: Payer: Medicaid Other | Admitting: Orthopedic Surgery

## 2016-10-01 ENCOUNTER — Ambulatory Visit: Payer: Medicaid Other | Admitting: Orthopedic Surgery

## 2016-10-22 ENCOUNTER — Encounter: Payer: Self-pay | Admitting: Orthopedic Surgery

## 2016-10-22 ENCOUNTER — Ambulatory Visit: Payer: Medicaid Other | Admitting: Orthopedic Surgery

## 2016-10-27 ENCOUNTER — Encounter: Payer: Self-pay | Admitting: Orthopedic Surgery

## 2016-11-04 ENCOUNTER — Encounter (HOSPITAL_COMMUNITY): Payer: Self-pay | Admitting: *Deleted

## 2016-11-04 ENCOUNTER — Emergency Department (HOSPITAL_COMMUNITY)
Admission: EM | Admit: 2016-11-04 | Discharge: 2016-11-04 | Disposition: A | Discharge status: Home / Self Care | Payer: Medicaid Other | Attending: Emergency Medicine | Admitting: Emergency Medicine

## 2016-11-04 ENCOUNTER — Emergency Department (HOSPITAL_COMMUNITY): Payer: Medicaid Other

## 2016-11-04 DIAGNOSIS — Z79899 Other long term (current) drug therapy: Secondary | ICD-10-CM | POA: Diagnosis not present

## 2016-11-04 DIAGNOSIS — E119 Type 2 diabetes mellitus without complications: Secondary | ICD-10-CM | POA: Diagnosis not present

## 2016-11-04 DIAGNOSIS — R05 Cough: Secondary | ICD-10-CM | POA: Diagnosis not present

## 2016-11-04 DIAGNOSIS — J449 Chronic obstructive pulmonary disease, unspecified: Secondary | ICD-10-CM | POA: Insufficient documentation

## 2016-11-04 DIAGNOSIS — R509 Fever, unspecified: Secondary | ICD-10-CM | POA: Insufficient documentation

## 2016-11-04 DIAGNOSIS — Z8541 Personal history of malignant neoplasm of cervix uteri: Secondary | ICD-10-CM | POA: Insufficient documentation

## 2016-11-04 DIAGNOSIS — R51 Headache: Secondary | ICD-10-CM | POA: Diagnosis not present

## 2016-11-04 DIAGNOSIS — F17210 Nicotine dependence, cigarettes, uncomplicated: Secondary | ICD-10-CM | POA: Insufficient documentation

## 2016-11-04 DIAGNOSIS — J45909 Unspecified asthma, uncomplicated: Secondary | ICD-10-CM | POA: Diagnosis not present

## 2016-11-04 DIAGNOSIS — I10 Essential (primary) hypertension: Secondary | ICD-10-CM | POA: Diagnosis not present

## 2016-11-04 DIAGNOSIS — M791 Myalgia, unspecified site: Secondary | ICD-10-CM | POA: Insufficient documentation

## 2016-11-04 DIAGNOSIS — R059 Cough, unspecified: Secondary | ICD-10-CM

## 2016-11-04 MED ORDER — AZITHROMYCIN 250 MG PO TABS: ORAL_TABLET | ORAL | 0 refills | Status: DC

## 2016-11-04 NOTE — ED Triage Notes (Addendum)
Pt c/o low grade fever, clear to yellow productive cough, chills, sweating, generalized body aches, headache x 2 weeks. Pt has used OTC medications with slight relief.

## 2016-11-04 NOTE — Discharge Instructions (Signed)
Follow-up with your primary doctor for recheck.  Return to ER for any worsening symptoms

## 2016-11-06 NOTE — ED Provider Notes (Signed)
[facility] Provider Note   CSN: [id number] Arrival date & time: 11/04/16  1110     History   Chief Complaint Chief Complaint  Patient presents with  . Cough    HPI [name] is a 51 y.o. female.  HPI  [name] is a 51 y.o. female who presents to the Emergency Department complaining of cough, chills, low grade fever, generalized body aches and frontal headache.  Symptoms present for 2 weeks. Cough occasionally productive.  States her symptoms feel similar to previous episodes of bronchitis.  She has used OTC cold and cough medication without relief.  She denies shortness of breath, chest pain, vomiting, and hemoptysis.     Past Medical History:  Diagnosis Date  . Asthma   . Bipolar disease, chronic ([place])   . Cancer (HCC)    cervical cancer. partial hysterectomy in 20s.   . Cervical cancer ([place])   . COPD (chronic obstructive pulmonary disease) ([place])   . DDD (degenerative disc disease)   . Diabetes mellitus    not on medication  . Factor 5 Leiden mutation, heterozygous ([place])   . Hep C w/o coma, chronic ([place])   . Hypertension   . 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 Pneumonia   . Seizures [facility])     Patient Active Problem List   Diagnosis Date Noted  . Constipation 03/27/2016    Past Surgical History:  Procedure Laterality Date  . ABDOMINAL HYSTERECTOMY     cervical cancer  . BACK SURGERY    . carpel tunnel    . COLONOSCOPY  20 yrs   [place]  . MASS EXCISION      OB History    No data available       Home Medications    Prior to Admission medications   Medication Sig Start Date End Date Taking? Authorizing Provider  albuterol (PROVENTIL HFA;VENTOLIN HFA) 108 (90 BASE) MCG/ACT inhaler Inhale 2 puffs into the lungs every 6 (six) hours as needed. For shortness of breath     [provider]  azithromycin (ZITHROMAX) 250 MG tablet Take first 2 tablets together, then 1 every day  until finished. 11/04/16   Aniko Finnigan, PA-C  beclomethasone (QVAR) 40 MCG/ACT inhaler Inhale 2 puffs into the lungs 2 (two) times daily.    [provider]  cyclobenzaprine (FLEXERIL) 10 MG tablet Take 1 tablet (10 mg total) by mouth 3 (three) times daily as needed for muscle spasms. 04/27/16   Francine Graven, DO  doxycycline (VIBRAMYCIN) 100 MG capsule Take 1 capsule (100 mg total) by mouth 2 (two) times daily. 05/05/16   Evalee Jefferson, PA-C  DULoxetine (CYMBALTA) 60 MG capsule Take 60 mg by mouth 2 (two) times daily.     [provider]  Fluticasone-Salmeterol (ADVAIR DISKUS) 250-50 MCG/DOSE AEPB Inhale 1 puff into the lungs every 12 (twelve) hours.      [provider]  gabapentin (NEURONTIN) 600 MG tablet Take 1,200 mg by mouth 2 (two) times daily.      [provider]  hydrochlorothiazide (HYDRODIURIL) 25 MG tablet Take 25 mg by mouth daily.      [provider]  levalbuterol Penne Lash) 1.25 MG/3ML nebulizer solution Take 1.25 mg by nebulization every 4 (four) hours as needed for wheezing or shortness of breath.    [provider]  loratadine (CLARITIN) 10 MG tablet Take 10 mg by mouth daily.      [provider]  lubiprostone (AMITIZA) 24 MCG capsule Take 1 capsule (24 mcg total) by mouth 2 (two) times daily with a meal. 03/27/16   Mahala Menghini, PA-C  omeprazole (PRILOSEC) 20 MG capsule Take 1 capsule (20 mg total) by mouth daily. 10/09/15   Mahala Menghini, PA-C  potassium chloride (K-DUR) 10 MEQ tablet Take 20 mEq by mouth 2 (two) times daily.     [provider]  TRAMADOL HCL ER PO Take 50 mg by mouth 2 (two) times daily.    [provider]    Family History Family History  Problem Relation Age of Onset  . Diabetes Mother   . Cancer Mother        breast   . Cancer Other   . Colon cancer Neg Hx   . Liver disease Neg Hx     Social History Social History  Substance Use Topics  . Smoking status:  Current Every Day Smoker    Packs/day: 1.00    Years: 37.00    Types: Cigarettes  . Smokeless tobacco: Never Used  . Alcohol use No     Allergies   Cephalexin; Abilify [aripiprazole]; Augmentin [amoxicillin-pot clavulanate]; Penicillins; Seroquel [quetiapine fumarate]; Aspirin; and Sulfa antibiotics   Review of Systems Review of Systems  Constitutional: Positive for fever. Negative for appetite change and chills.  HENT: Positive for congestion. Negative for sore throat and trouble swallowing.   Respiratory: Positive for cough. Negative for chest tightness, shortness of breath and wheezing.   Cardiovascular: Negative for chest pain.  Gastrointestinal: Negative for abdominal pain, nausea and vomiting.  Genitourinary: Negative for dysuria and flank pain.  Musculoskeletal: Positive for myalgias. Negative for arthralgias, neck pain and neck stiffness.  Skin: Negative for rash.  Neurological: Positive for headaches. Negative for dizziness, weakness and numbness.  Hematological: Negative for adenopathy.  All other systems reviewed and are negative.    Physical Exam Updated Vital Signs BP (!) 126/97 (BP Location: Right Arm)   Pulse 93   Temp 98.5 F (36.9 C) (Oral)   Resp 16   Ht 5\' 1"  (1.549 m)   Wt 79.4 kg (175 lb)   SpO2 97%   BMI 33.07 kg/m   Physical Exam  Constitutional: She is oriented to person, place, and time. She appears well-developed and well-nourished. No distress.  HENT:  Mouth/Throat: Oropharynx is clear and moist.  Neck: Normal range of motion.  Cardiovascular: Normal rate, regular rhythm and intact distal pulses.   No murmur heard. Pulmonary/Chest: Effort normal. No respiratory distress. She exhibits no tenderness.  Abdominal: Soft. She exhibits no distension. There is no tenderness. There is no guarding.  Musculoskeletal: Normal range of motion.  Neurological: She is alert and oriented to person, place, and time. No sensory deficit.  Skin: Skin is warm.  Capillary refill takes less than 2 seconds. No rash noted.  Psychiatric: She has a normal mood and affect.  Nursing note and vitals reviewed.    ED Treatments / Results  Labs (all labs ordered are listed, but only abnormal results are displayed) Labs Reviewed - No data to display  EKG  EKG Interpretation None       Radiology Dg Chest 2 View  Result Date: 11/04/2016 CLINICAL DATA:  Cough.  Low-grade fever. EXAM: CHEST  2 VIEW COMPARISON:  08/29/2016. FINDINGS: The heart size and mediastinal contours are within normal limits. Both lungs are clear. Mild scarring RIGHT base. The visualized skeletal structures are unremarkable. Compared with priors, improved bronchitic change. IMPRESSION: No active  cardiopulmonary disease.  No consolidation or edema. Electronically Signed   By: Staci Righter M.D.   On: 11/04/2016 13:25     Procedures Procedures (including critical care time)  Medications Ordered in ED Medications - No data to display   Initial Impression / Assessment and Plan / ED Course  I have reviewed the triage vital signs and the nursing notes.  Pertinent labs & imaging results that were available during my care of the patient were reviewed by me and considered in my medical decision making (see chart for details).    Pt well appearing, non-toxic appearing.  XR neg for PNA.  No concerning sx's  For PE.  Pt appear stable for d/c   Final Clinical Impressions(s) / ED Diagnoses   Final diagnoses:  Cough    New Prescriptions Discharge Medication List as of 11/04/2016  2:45 PM       Kem Parkinson, PA-C 11/07/16 2026    Francine Graven, DO 11/09/16 1206

## 2016-11-10 ENCOUNTER — Ambulatory Visit: Payer: Medicaid Other | Admitting: Orthopedic Surgery

## 2016-11-10 ENCOUNTER — Encounter: Payer: Self-pay | Admitting: Orthopedic Surgery

## 2016-11-10 ENCOUNTER — Ambulatory Visit (INDEPENDENT_AMBULATORY_CARE_PROVIDER_SITE_OTHER): Payer: Medicaid Other

## 2016-11-10 VITALS — BP 106/72 | HR 89 | Ht 61.0 in | Wt 178.0 lb

## 2016-11-10 DIAGNOSIS — M67431 Ganglion, right wrist: Secondary | ICD-10-CM

## 2016-11-10 NOTE — Progress Notes (Signed)
NEW PATIENT OFFICE VISIT    Chief Complaint  Patient presents with  . Wrist Problem    right wrist Ganglion     51 year old female had 2 dorsal wrist ganglions excised by Dr. Burney Gauze.  They have returned with change in size.  She complains of pain and discomfort over the dorsal portion of the hand as well.  There has been no trauma.  Pain is dull and constant mild to moderate associated with swelling    Review of Systems  Skin: Negative.   Neurological: Positive for tingling and sensory change.     Past Medical History:  Diagnosis Date  . Asthma   . Bipolar disease, chronic ([place])   . Cancer (HCC)    cervical cancer. partial hysterectomy in 20s.   . Cervical cancer ([place])   . COPD (chronic obstructive pulmonary disease) ([place])   . DDD (degenerative disc disease)   . Diabetes mellitus    not on medication  . Factor 5 Leiden mutation, heterozygous ([place])   . Hep C w/o coma, chronic ([place])   . Hypertension   . 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 Pneumonia   . Seizures ([place])     Past Surgical History:  Procedure Laterality Date  . ABDOMINAL HYSTERECTOMY     cervical cancer  . BACK SURGERY    . carpel tunnel    . COLONOSCOPY  20 yrs   [place]  . MASS EXCISION      Family History  Problem Relation Age of Onset  . Diabetes Mother   . Cancer Mother        breast   . Cancer Other   . Colon cancer Neg Hx   . Liver disease Neg Hx    Social History   Tobacco Use  . Smoking status: Current Every Day Smoker    Packs/day: 1.00    Years: 37.00    Pack years: 37.00    Types: Cigarettes  . Smokeless tobacco: Never Used  Substance Use Topics  . Alcohol use: No  . Drug use: Yes    Types: Marijuana    Comment: remote IV drugs. last intranasal drugs 04/2015    @ALL @  Current Meds  Medication Sig  . albuterol (PROVENTIL HFA;VENTOLIN HFA) 108 (90 BASE) MCG/ACT inhaler Inhale 2 puffs into the lungs every 6 (six) hours as needed. For  shortness of breath   . beclomethasone (QVAR) 40 MCG/ACT inhaler Inhale 2 puffs into the lungs 2 (two) times daily.  Marland Kitchen doxycycline (VIBRAMYCIN) 100 MG capsule Take 1 capsule (100 mg total) by mouth 2 (two) times daily.  Marland Kitchen gabapentin (NEURONTIN) 600 MG tablet Take 1,200 mg by mouth 2 (two) times daily.    . hydrochlorothiazide (HYDRODIURIL) 25 MG tablet Take 25 mg by mouth daily.    Marland Kitchen levalbuterol (XOPENEX) 1.25 MG/3ML nebulizer solution Take 1.25 mg by nebulization every 4 (four) hours as needed for wheezing or shortness of breath.  . loratadine (CLARITIN) 10 MG tablet Take 10 mg by mouth daily.    Marland Kitchen omeprazole (PRILOSEC) 20 MG capsule Take 1 capsule (20 mg total) by mouth daily.  . potassium chloride (K-DUR) 10 MEQ tablet Take 20 mEq by mouth 2 (two) times daily.   . TRAMADOL HCL ER PO Take 50 mg by mouth 2 (two) times daily.    BP 106/72   Pulse 89   Ht 5\' 1"  (1.549 m)   Wt 178 lb (80.7 kg)   BMI 33.63 kg/m  Physical Exam  Constitutional: She is oriented to person, place, and time. She appears well-developed and well-nourished.  Neurological: She is alert and oriented to person, place, and time.  Psychiatric: She has a normal mood and affect. Judgment normal.  Vitals reviewed.   Ortho Exam Normal ambulatory pattern Epitrochlear lymph nodes normal on the right Over the right wrist we see prior incision and a lobulated mass that looks like a ganglion cyst.  The wrist range of motion remains normal as does stability motor exam is intact skin incisions non-erythematous  Normal sensation and pulse  X-ray shows a normal wrist please see my dictated report   Encounter Diagnosis  Name Primary?  . Ganglion cyst of wrist, right Yes     PLAN:   Recommend follow-up with Dr. Burney Gauze or hand specialist as recurrent ganglions are not part of our practice;  I did tell her she can follow-up with me locally if the mass stays for more than 7 days for me to check it but there is no  cancerous lesion here

## 2016-11-10 NOTE — Patient Instructions (Signed)
Steps to Quit Smoking Smoking tobacco can be bad for your health. It can also affect almost every organ in your body. Smoking puts you and people around you at risk for many serious long-lasting (chronic) diseases. Quitting smoking is hard, but it is one of the best things that you can do for your health. It is never too late to quit. What are the benefits of quitting smoking? When you quit smoking, you lower your risk for getting serious diseases and conditions. They can include:  Lung cancer or lung disease.  Heart disease.  Stroke.  Heart attack.  Not being able to have children (infertility).  Weak bones (osteoporosis) and broken bones (fractures).  If you have coughing, wheezing, and shortness of breath, those symptoms may get better when you quit. You may also get sick less often. If you are pregnant, quitting smoking can help to lower your chances of having a baby of low birth weight. What can I do to help me quit smoking? Talk with your doctor about what can help you quit smoking. Some things you can do (strategies) include:  Quitting smoking totally, instead of slowly cutting back how much you smoke over a period of time.  Going to in-person counseling. You are more likely to quit if you go to many counseling sessions.  Using resources and support systems, such as: ? Online chats with a counselor. ? Phone quitlines. ? Printed self-help materials. ? Support groups or group counseling. ? Text messaging programs. ? Mobile phone apps or applications.  Taking medicines. Some of these medicines may have nicotine in them. If you are pregnant or breastfeeding, do not take any medicines to quit smoking unless your doctor says it is okay. Talk with your doctor about counseling or other things that can help you.  Talk with your doctor about using more than one strategy at the same time, such as taking medicines while you are also going to in-person counseling. This can help make  quitting easier. What things can I do to make it easier to quit? Quitting smoking might feel very hard at first, but there is a lot that you can do to make it easier. Take these steps:  Talk to your family and friends. Ask them to support and encourage you.  Call phone quitlines, reach out to support groups, or work with a counselor.  Ask people who smoke to not smoke around you.  Avoid places that make you want (trigger) to smoke, such as: ? Bars. ? Parties. ? Smoke-break areas at work.  Spend time with people who do not smoke.  Lower the stress in your life. Stress can make you want to smoke. Try these things to help your stress: ? Getting regular exercise. ? Deep-breathing exercises. ? Yoga. ? Meditating. ? Doing a body scan. To do this, close your eyes, focus on one area of your body at a time from head to toe, and notice which parts of your body are tense. Try to relax the muscles in those areas.  Download or buy apps on your mobile phone or tablet that can help you stick to your quit plan. There are many free apps, such as QuitGuide from the CDC (Centers for Disease Control and Prevention). You can find more support from smokefree.gov and other websites.  This information is not intended to replace advice given to you by your health care provider. Make sure you discuss any questions you have with your health care provider. Document Released: 10/19/2008 Document   Revised: 08/21/2015 Document Reviewed: 05/09/2014 Elsevier Interactive Patient Education  2018 Elsevier Inc.  

## 2016-11-14 ENCOUNTER — Ambulatory Visit (HOSPITAL_COMMUNITY)
Admission: RE | Admit: 2016-11-14 | Discharge: 2016-11-14 | Disposition: A | Discharge status: Home / Self Care | Payer: Medicaid Other | Source: Ambulatory Visit | Attending: Internal Medicine | Admitting: Internal Medicine

## 2016-11-14 ENCOUNTER — Other Ambulatory Visit (HOSPITAL_COMMUNITY): Payer: Self-pay | Admitting: Internal Medicine

## 2016-11-14 DIAGNOSIS — M79642 Pain in left hand: Secondary | ICD-10-CM | POA: Insufficient documentation

## 2016-11-14 DIAGNOSIS — R52 Pain, unspecified: Secondary | ICD-10-CM

## 2016-11-16 ENCOUNTER — Other Ambulatory Visit: Payer: Self-pay

## 2016-11-16 ENCOUNTER — Emergency Department (HOSPITAL_COMMUNITY): Payer: Medicaid Other

## 2016-11-16 ENCOUNTER — Emergency Department (HOSPITAL_COMMUNITY)
Admission: EM | Admit: 2016-11-16 | Discharge: 2016-11-16 | Disposition: A | Discharge status: Home / Self Care | Payer: Medicaid Other | Attending: Emergency Medicine | Admitting: Emergency Medicine

## 2016-11-16 ENCOUNTER — Encounter (HOSPITAL_COMMUNITY): Payer: Self-pay | Admitting: *Deleted

## 2016-11-16 DIAGNOSIS — J449 Chronic obstructive pulmonary disease, unspecified: Secondary | ICD-10-CM | POA: Diagnosis not present

## 2016-11-16 DIAGNOSIS — M25512 Pain in left shoulder: Secondary | ICD-10-CM | POA: Insufficient documentation

## 2016-11-16 DIAGNOSIS — Y939 Activity, unspecified: Secondary | ICD-10-CM | POA: Insufficient documentation

## 2016-11-16 DIAGNOSIS — W1789XA Other fall from one level to another, initial encounter: Secondary | ICD-10-CM | POA: Diagnosis not present

## 2016-11-16 DIAGNOSIS — Y9272 Chicken coop as the place of occurrence of the external cause: Secondary | ICD-10-CM | POA: Insufficient documentation

## 2016-11-16 DIAGNOSIS — E119 Type 2 diabetes mellitus without complications: Secondary | ICD-10-CM | POA: Insufficient documentation

## 2016-11-16 DIAGNOSIS — M542 Cervicalgia: Secondary | ICD-10-CM | POA: Insufficient documentation

## 2016-11-16 DIAGNOSIS — F17210 Nicotine dependence, cigarettes, uncomplicated: Secondary | ICD-10-CM | POA: Diagnosis not present

## 2016-11-16 DIAGNOSIS — I10 Essential (primary) hypertension: Secondary | ICD-10-CM | POA: Insufficient documentation

## 2016-11-16 DIAGNOSIS — F1210 Cannabis abuse, uncomplicated: Secondary | ICD-10-CM | POA: Diagnosis not present

## 2016-11-16 DIAGNOSIS — S0990XA Unspecified injury of head, initial encounter: Secondary | ICD-10-CM | POA: Diagnosis not present

## 2016-11-16 DIAGNOSIS — Z8541 Personal history of malignant neoplasm of cervix uteri: Secondary | ICD-10-CM | POA: Insufficient documentation

## 2016-11-16 DIAGNOSIS — J45909 Unspecified asthma, uncomplicated: Secondary | ICD-10-CM | POA: Diagnosis not present

## 2016-11-16 DIAGNOSIS — W19XXXA Unspecified fall, initial encounter: Secondary | ICD-10-CM

## 2016-11-16 DIAGNOSIS — Y998 Other external cause status: Secondary | ICD-10-CM | POA: Insufficient documentation

## 2016-11-16 LAB — CBC WITH DIFFERENTIAL/PLATELET
BASOS ABS: 0 10*3/uL (ref 0.0–0.1)
BASOS PCT: 0 %
EOS PCT: 2 %
Eosinophils Absolute: 0.2 10*3/uL (ref 0.0–0.7)
HCT: 38.1 % (ref 36.0–46.0)
Hemoglobin: 13 g/dL (ref 12.0–15.0)
Lymphocytes Relative: 38 %
Lymphs Abs: 3.9 10*3/uL (ref 0.7–4.0)
MCH: 32 pg (ref 26.0–34.0)
MCHC: 34.1 g/dL (ref 30.0–36.0)
MCV: 93.8 fL (ref 78.0–100.0)
MONO ABS: 0.9 10*3/uL (ref 0.1–1.0)
MONOS PCT: 9 %
Neutro Abs: 5.3 10*3/uL (ref 1.7–7.7)
Neutrophils Relative %: 51 %
Platelets: 221 10*3/uL (ref 150–400)
RBC: 4.06 MIL/uL (ref 3.87–5.11)
RDW: 13.1 % (ref 11.5–15.5)
WBC: 10.3 10*3/uL (ref 4.0–10.5)

## 2016-11-16 LAB — COMPREHENSIVE METABOLIC PANEL
ALT: 12 U/L — ABNORMAL LOW (ref 14–54)
ANION GAP: 8 (ref 5–15)
AST: 16 U/L (ref 15–41)
Albumin: 3.7 g/dL (ref 3.5–5.0)
Alkaline Phosphatase: 73 U/L (ref 38–126)
BUN: 11 mg/dL (ref 6–20)
CHLORIDE: 105 mmol/L (ref 101–111)
CO2: 25 mmol/L (ref 22–32)
Calcium: 8.7 mg/dL — ABNORMAL LOW (ref 8.9–10.3)
Creatinine, Ser: 0.71 mg/dL (ref 0.44–1.00)
GFR calc Af Amer: >60 mL/min (ref >60)
GLUCOSE: 107 mg/dL — AB (ref 65–99)
POTASSIUM: 3 mmol/L — AB (ref 3.5–5.1)
Sodium: 138 mmol/L (ref 135–145)
Total Bilirubin: 0.7 mg/dL (ref 0.3–1.2)
Total Protein: 6.6 g/dL (ref 6.5–8.1)

## 2016-11-16 LAB — LIPASE, BLOOD: Lipase: 26 U/L (ref 11–51)

## 2016-11-16 MED ORDER — SODIUM CHLORIDE 0.9 % IV BOLUS (SEPSIS)
1000.0000 mL | Freq: Once | INTRAVENOUS | Status: AC
  Administered 2016-11-16: 1000 mL via INTRAVENOUS

## 2016-11-16 MED ORDER — FENTANYL CITRATE (PF) 100 MCG/2ML IJ SOLN
50.0000 ug | Freq: Once | INTRAMUSCULAR | Status: AC
  Administered 2016-11-16: 50 ug via INTRAVENOUS
  Filled 2016-11-16: qty 2

## 2016-11-16 MED ORDER — ONDANSETRON HCL 4 MG/2ML IJ SOLN
4.0000 mg | Freq: Once | INTRAMUSCULAR | Status: AC
  Administered 2016-11-16: 4 mg via INTRAVENOUS
  Filled 2016-11-16: qty 2

## 2016-11-16 MED ORDER — HYDROCODONE-ACETAMINOPHEN 5-325 MG PO TABS: 1.0000 | ORAL_TABLET | ORAL | 0 refills | Status: DC | PRN

## 2016-11-16 MED ORDER — IOPAMIDOL (ISOVUE-300) INJECTION 61%
100.0000 mL | Freq: Once | INTRAVENOUS | Status: AC | PRN
  Administered 2016-11-16: 100 mL via INTRAVENOUS

## 2016-11-16 NOTE — ED Notes (Addendum)
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

## 2016-11-16 NOTE — ED Notes (Signed)
Pt instructed to leave C-collar on- upon entering room pt had taken off c-collar and refuses to put it back on.

## 2016-11-16 NOTE — ED Provider Notes (Signed)
[facility] EMERGENCY DEPARTMENT Provider Note   CSN: [id number] Arrival date & time: 11/16/16  1704     History   Chief Complaint Chief Complaint  Patient presents with  . Fall    HPI [name] is a 51 y.o. female.  Level 5 caveat for urgent need for intervention.  Patient apparently fell through the roof of her chicken coop a brief time ago.  It is uncertain how she landed. She complains of pain in her head, neck, left side of her abdomen, left shoulder.  No loss of consciousness or neurological deficits.  Severity of pain is moderate.      Past Medical History:  Diagnosis Date  . Asthma   . Bipolar disease, chronic ([place])   . Cancer (HCC)    cervical cancer. partial hysterectomy in 20s.   . Cervical cancer ([place])   . COPD (chronic obstructive pulmonary disease) ([place])   . DDD (degenerative disc disease)   . Diabetes mellitus    not on medication  . Factor 5 Leiden mutation, heterozygous ([place])   . Hep C w/o coma, chronic ([place])   . Hypertension   . 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 Pneumonia   . Seizures [facility])     Patient Active Problem List   Diagnosis Date Noted  . Constipation 03/27/2016    Past Surgical History:  Procedure Laterality Date  . ABDOMINAL HYSTERECTOMY     cervical cancer  . BACK SURGERY    . carpel tunnel    . COLONOSCOPY  20 yrs   [place]  . MASS EXCISION      OB History    No data available       Home Medications    Prior to Admission medications   Medication Sig Start Date End Date Taking? Authorizing Provider  albuterol (PROVENTIL HFA;VENTOLIN HFA) 108 (90 BASE) MCG/ACT inhaler Inhale 2 puffs into the lungs every 6 (six) hours as needed. For shortness of breath    Yes [provider]  beclomethasone (QVAR) 40 MCG/ACT inhaler Inhale 2 puffs into the lungs 2 (two) times daily.   Yes [provider]  gabapentin (NEURONTIN) 600 MG tablet Take 2,400 mg 2 (two) times daily  by mouth.    Yes [provider]  hydrochlorothiazide (HYDRODIURIL) 25 MG tablet Take 25 mg by mouth daily.     Yes [provider]  levalbuterol (XOPENEX) 1.25 MG/3ML nebulizer solution Take 1.25 mg by nebulization every 4 (four) hours as needed for wheezing or shortness of breath.   Yes [provider]  loratadine (CLARITIN) 10 MG tablet Take 10 mg by mouth daily.     Yes [provider]  mupirocin ointment (BACTROBAN) 2 % Apply 1 application daily as needed topically (for open sores to prevent MRSA).   Yes [provider]  omeprazole (PRILOSEC) 20 MG capsule Take 1 capsule (20 mg total) by mouth daily. 10/09/15  Yes Mahala Menghini, PA-C  Potassium Chloride ER 20 MEQ TBCR Take 20 mEq daily by mouth.    Yes [provider]  TRAMADOL HCL ER PO Take 50 mg by mouth 2 (two) times daily.   Yes [provider]  cyclobenzaprine (FLEXERIL) 10 MG tablet Take 1 tablet (10 mg total) by mouth 3 (three) times daily as needed for muscle spasms. Patient not taking: Reported on 11/10/2016 04/27/16   Francine Graven, DO  HYDROcodone-acetaminophen (NORCO/VICODIN) 5-325 MG tablet Take 1 tablet every 4 (four) hours as  needed by mouth. 11/16/16   Nat Christen, MD    Family History Family History  Problem Relation Age of Onset  . Diabetes Mother   . Cancer Mother        breast   . Cancer Other   . Colon cancer Neg Hx   . Liver disease Neg Hx     Social History Social History   Tobacco Use  . Smoking status: Current Every Day Smoker    Packs/day: 1.00    Years: 37.00    Pack years: 37.00    Types: Cigarettes  . Smokeless tobacco: Never Used  Substance Use Topics  . Alcohol use: No  . Drug use: Yes    Types: Marijuana    Comment: remote IV drugs. last intranasal drugs 04/2015     Allergies   Cephalexin; Abilify [aripiprazole]; Augmentin [amoxicillin-pot clavulanate]; Keflex [cephalexin]; Penicillins; Seroquel [quetiapine fumarate];  Aspirin; and Sulfa antibiotics   Review of Systems Review of Systems  All other systems reviewed and are negative.    Physical Exam Updated Vital Signs BP 113/76   Pulse 77   Temp 98.7 F (37.1 C) (Oral)   Resp 17   Ht 5\' 1"  (1.549 m)   Wt 79.4 kg (175 lb)   SpO2 98%   BMI 33.07 kg/m   Physical Exam  Constitutional: She is oriented to person, place, and time. She appears well-developed and well-nourished.  HENT:  Head: Normocephalic and atraumatic.  Eyes: Conjunctivae are normal.  Neck:  Generalized posterior neck tenderness  Cardiovascular: Normal rate and regular rhythm.  Pulmonary/Chest: Effort normal and breath sounds normal.  Abdominal: Soft. Bowel sounds are normal.  Tender left upper quadrant and left lower quadrant  Musculoskeletal:  Generalized left shoulder tenderness  Neurological: She is alert and oriented to person, place, and time.  Skin: Skin is warm and dry.  Psychiatric: She has a normal mood and affect. Her behavior is normal.  Nursing note and vitals reviewed.    ED Treatments / Results  Labs (all labs ordered are listed, but only abnormal results are displayed) Labs Reviewed  COMPREHENSIVE METABOLIC PANEL - Abnormal; Notable for the following components:      Result Value   Potassium 3.0 (*)    Glucose, Bld 107 (*)    Calcium 8.7 (*)    ALT 12 (*)    All other components within normal limits  CBC WITH DIFFERENTIAL/PLATELET  LIPASE, BLOOD    EKG  EKG Interpretation None       Radiology Ct Head Wo Contrast  Result Date: 11/16/2016 CLINICAL DATA:  51 year old female with trauma. EXAM: CT HEAD WITHOUT CONTRAST CT CERVICAL SPINE WITHOUT CONTRAST TECHNIQUE: Multidetector CT imaging of the head and cervical spine was performed following the standard protocol without intravenous contrast. Multiplanar CT image reconstructions of the cervical spine were also generated. COMPARISON:  Head CT dated 04/27/2016 or FINDINGS: CT HEAD FINDINGS  Brain: The ventricles and sulci appropriate size for patient's age. The gray-white matter discrimination is preserved. Bilateral basal ganglia calcification noted. There is no acute intracranial hemorrhage. No mass effect or midline shift. No extra-axial fluid collection. Vascular: No hyperdense vessel or unexpected calcification. Skull: Normal. Negative for fracture or focal lesion. Sinuses/Orbits: No acute finding. Other: None CT CERVICAL SPINE FINDINGS Alignment: No acute subluxation. Skull base and vertebrae: No acute fracture. No primary bone lesion or focal pathologic process. Soft tissues and spinal canal: No prevertebral fluid or swelling. No visible canal hematoma. Disc levels:  There degenerative  changes primarily at C5-C6. Upper chest: Negative. Other: None IMPRESSION: 1. No acute intracranial pathology. 2. No acute/traumatic cervical spine pathology. Electronically Signed   By: Anner Crete M.D.   On: 11/16/2016 22:09   Ct Cervical Spine Wo Contrast  Result Date: 11/16/2016 CLINICAL DATA:  51 year old female with trauma. EXAM: CT HEAD WITHOUT CONTRAST CT CERVICAL SPINE WITHOUT CONTRAST TECHNIQUE: Multidetector CT imaging of the head and cervical spine was performed following the standard protocol without intravenous contrast. Multiplanar CT image reconstructions of the cervical spine were also generated. COMPARISON:  Head CT dated 04/27/2016 or FINDINGS: CT HEAD FINDINGS Brain: The ventricles and sulci appropriate size for patient's age. The gray-white matter discrimination is preserved. Bilateral basal ganglia calcification noted. There is no acute intracranial hemorrhage. No mass effect or midline shift. No extra-axial fluid collection. Vascular: No hyperdense vessel or unexpected calcification. Skull: Normal. Negative for fracture or focal lesion. Sinuses/Orbits: No acute finding. Other: None CT CERVICAL SPINE FINDINGS Alignment: No acute subluxation. Skull base and vertebrae: No acute  fracture. No primary bone lesion or focal pathologic process. Soft tissues and spinal canal: No prevertebral fluid or swelling. No visible canal hematoma. Disc levels:  There degenerative changes primarily at C5-C6. Upper chest: Negative. Other: None IMPRESSION: 1. No acute intracranial pathology. 2. No acute/traumatic cervical spine pathology. Electronically Signed   By: Anner Crete M.D.   On: 11/16/2016 22:09   Ct Abdomen Pelvis W Contrast  Result Date: 11/16/2016 CLINICAL DATA:  51 y/o  F; fall with trauma. EXAM: CT ABDOMEN AND PELVIS WITH CONTRAST TECHNIQUE: Multidetector CT imaging of the abdomen and pelvis was performed using the standard protocol following bolus administration of intravenous contrast. CONTRAST:  132mL ISOVUE-300 IOPAMIDOL (ISOVUE-300) INJECTION 61% COMPARISON:  None. FINDINGS: Lower chest: No acute abnormality. Hepatobiliary: No focal liver abnormality is seen. No gallstones, gallbladder wall thickening, or biliary dilatation. Pancreas: Unremarkable. No pancreatic ductal dilatation or surrounding inflammatory changes. Spleen: Normal in size without focal abnormality. Adrenals/Urinary Tract: Normal adrenal glands. 3 mm stones in right kidney upper pole of left kidney interpolar. No hydronephrosis or ureter stone. Normal bladder. Stomach/Bowel: Stomach is within normal limits. Appendix appears normal. No evidence of bowel wall thickening, distention, or inflammatory changes. Vascular/Lymphatic: Aortic atherosclerosis. No enlarged abdominal or pelvic lymph nodes. Reproductive: Status post hysterectomy. No adnexal masses. Other: No abdominal wall hernia or abnormality. No abdominopelvic ascites. Musculoskeletal: No acute fracture identified. Sclerosis of articular surfaces of right-greater-than-left sacroiliac joints compatible with osteitis condensans. Multilevel degenerative changes of the spine with loss of disc space height greatest at the T12-L2 levels large calcified disc bulge at  L1-2 with mild canal stenosis. Prominent lower lumbar facet arthrosis and grade 1 L5-S1 anterolisthesis. IMPRESSION: 1. No acute fracture or internal injury identified. 2. Bilateral nonobstructive nephrolithiasis. Electronically Signed   By: Kristine Garbe M.D.   On: 11/16/2016 22:05   Dg Shoulder Left  Result Date: 11/16/2016 CLINICAL DATA:  Status post fall through roof of chicken coop, with severe left shoulder pain. Initial encounter. EXAM: LEFT SHOULDER - 2+ VIEW COMPARISON:  Left shoulder radiographs performed 12/19/2012 FINDINGS: There is no evidence of fracture or dislocation. The left humeral head is seated within the glenoid fossa. The acromioclavicular joint is unremarkable in appearance. No significant soft tissue abnormalities are seen. The visualized portions of the left lung are clear. IMPRESSION: No evidence of fracture or dislocation. Electronically Signed   By: Garald Balding M.D.   On: 11/16/2016 22:02    Procedures Procedures (including critical  care time)  Medications Ordered in ED Medications  sodium chloride 0.9 % bolus 1,000 mL (0 mLs Intravenous Stopped 11/16/16 2126)  ondansetron (ZOFRAN) injection 4 mg (4 mg Intravenous Given 11/16/16 1825)  fentaNYL (SUBLIMAZE) injection 50 mcg (50 mcg Intravenous Given 11/16/16 1825)  iopamidol (ISOVUE-300) 61 % injection 100 mL (100 mLs Intravenous Contrast Given 11/16/16 2038)     Initial Impression / Assessment and Plan / ED Course  I have reviewed the triage vital signs and the nursing notes.  Pertinent labs & imaging results that were available during my care of the patient were reviewed by me and considered in my medical decision making (see chart for details).     Patient is alert and oriented x3.  No gross neurological deficits.  CT abdomen pelvis was obtained to rule out splenic rupture.  CT head, CT cervical spine, CT abdomen pelvis, plain films of left shoulder all negative.  Patient was rechecked multiple  times.  Discharge medications Vicodin  Final Clinical Impressions(s) / ED Diagnoses   Final diagnoses:  Fall, initial encounter  Minor head injury, initial encounter  Neck pain  Acute pain of left shoulder    ED Discharge Orders        Ordered    HYDROcodone-acetaminophen (NORCO/VICODIN) 5-325 MG tablet  Every 4 hours PRN     11/16/16 2215       Nat Christen, MD 11/18/16 0725

## 2016-11-16 NOTE — ED Notes (Signed)
Call from CT- Pt is a diabetic and labs will need to be drawn-

## 2016-11-16 NOTE — ED Notes (Signed)
Pt in x-ray/CT at this time.

## 2016-11-16 NOTE — ED Triage Notes (Signed)
Pt states that she fell through her chicken coop today, approximately 7 feet, pt states that she was on top of the coop trying to fix it and fell through it. Unsure of any LOC, has -collar in place from ems, cms intact all extremities. Pt did hit her head on a concrete block, c/o pain to the top of the head, left hip, left upper arm and right knee.

## 2016-11-16 NOTE — Discharge Instructions (Signed)
X-rays of head, neck, abdomen, pelvis, left shoulder show no abnormalities.  You will be sore for several days.  Ice pack to tender areas.  Prescription for pain medicine.

## 2016-12-20 ENCOUNTER — Other Ambulatory Visit: Payer: Self-pay

## 2016-12-20 ENCOUNTER — Encounter (HOSPITAL_COMMUNITY): Payer: Self-pay | Admitting: Emergency Medicine

## 2016-12-20 ENCOUNTER — Emergency Department (HOSPITAL_COMMUNITY)
Admission: EM | Admit: 2016-12-20 | Discharge: 2016-12-20 | Disposition: A | Discharge status: Home / Self Care | Payer: Medicaid Other | Attending: Emergency Medicine | Admitting: Emergency Medicine

## 2016-12-20 ENCOUNTER — Emergency Department (HOSPITAL_COMMUNITY): Payer: Medicaid Other

## 2016-12-20 DIAGNOSIS — I10 Essential (primary) hypertension: Secondary | ICD-10-CM | POA: Insufficient documentation

## 2016-12-20 DIAGNOSIS — R55 Syncope and collapse: Secondary | ICD-10-CM | POA: Insufficient documentation

## 2016-12-20 DIAGNOSIS — E119 Type 2 diabetes mellitus without complications: Secondary | ICD-10-CM | POA: Diagnosis not present

## 2016-12-20 DIAGNOSIS — J45909 Unspecified asthma, uncomplicated: Secondary | ICD-10-CM | POA: Insufficient documentation

## 2016-12-20 DIAGNOSIS — F17210 Nicotine dependence, cigarettes, uncomplicated: Secondary | ICD-10-CM | POA: Insufficient documentation

## 2016-12-20 DIAGNOSIS — J449 Chronic obstructive pulmonary disease, unspecified: Secondary | ICD-10-CM | POA: Diagnosis not present

## 2016-12-20 DIAGNOSIS — Z79899 Other long term (current) drug therapy: Secondary | ICD-10-CM | POA: Insufficient documentation

## 2016-12-20 DIAGNOSIS — Z8541 Personal history of malignant neoplasm of cervix uteri: Secondary | ICD-10-CM | POA: Insufficient documentation

## 2016-12-20 DIAGNOSIS — H538 Other visual disturbances: Secondary | ICD-10-CM | POA: Insufficient documentation

## 2016-12-20 LAB — TROPONIN I: Troponin I: <0.03 ng/mL (ref <0.03)

## 2016-12-20 LAB — CBC WITH DIFFERENTIAL/PLATELET
BASOS ABS: 0 10*3/uL (ref 0.0–0.1)
BASOS PCT: 0 %
EOS ABS: 0.1 10*3/uL (ref 0.0–0.7)
Eosinophils Relative: 1 %
HEMATOCRIT: 45.7 % (ref 36.0–46.0)
Hemoglobin: 15.3 g/dL — ABNORMAL HIGH (ref 12.0–15.0)
Lymphocytes Relative: 23 %
Lymphs Abs: 2.2 10*3/uL (ref 0.7–4.0)
MCH: 31.9 pg (ref 26.0–34.0)
MCHC: 33.5 g/dL (ref 30.0–36.0)
MCV: 95.4 fL (ref 78.0–100.0)
MONO ABS: 0.6 10*3/uL (ref 0.1–1.0)
Monocytes Relative: 6 %
NEUTROS ABS: 6.7 10*3/uL (ref 1.7–7.7)
NEUTROS PCT: 70 %
Platelets: 266 10*3/uL (ref 150–400)
RBC: 4.79 MIL/uL (ref 3.87–5.11)
RDW: 12.9 % (ref 11.5–15.5)
WBC: 9.6 10*3/uL (ref 4.0–10.5)

## 2016-12-20 LAB — COMPREHENSIVE METABOLIC PANEL
ALBUMIN: 4.3 g/dL (ref 3.5–5.0)
ALT: 16 U/L (ref 14–54)
AST: 20 U/L (ref 15–41)
Alkaline Phosphatase: 89 U/L (ref 38–126)
Anion gap: 8 (ref 5–15)
BILIRUBIN TOTAL: 0.5 mg/dL (ref 0.3–1.2)
BUN: 12 mg/dL (ref 6–20)
CHLORIDE: 105 mmol/L (ref 101–111)
CO2: 28 mmol/L (ref 22–32)
Calcium: 9.8 mg/dL (ref 8.9–10.3)
Creatinine, Ser: 0.82 mg/dL (ref 0.44–1.00)
GFR calc Af Amer: >60 mL/min (ref >60)
GFR calc non Af Amer: >60 mL/min (ref >60)
GLUCOSE: 121 mg/dL — AB (ref 65–99)
POTASSIUM: 4 mmol/L (ref 3.5–5.1)
SODIUM: 141 mmol/L (ref 135–145)
Total Protein: 7.6 g/dL (ref 6.5–8.1)

## 2016-12-20 LAB — URINALYSIS, ROUTINE W REFLEX MICROSCOPIC
BACTERIA UA: NONE SEEN
Bilirubin Urine: NEGATIVE
GLUCOSE, UA: NEGATIVE mg/dL
HGB URINE DIPSTICK: NEGATIVE
KETONES UR: NEGATIVE mg/dL
LEUKOCYTES UA: NEGATIVE
NITRITE: NEGATIVE
PH: 5 (ref 5.0–8.0)
PROTEIN: NEGATIVE mg/dL
Specific Gravity, Urine: 1.015 (ref 1.005–1.030)

## 2016-12-20 MED ORDER — SODIUM CHLORIDE 0.9 % IV BOLUS (SEPSIS)
1000.0000 mL | Freq: Once | INTRAVENOUS | Status: AC
  Administered 2016-12-20: 1000 mL via INTRAVENOUS

## 2016-12-20 NOTE — ED Triage Notes (Signed)
Pt states that she passed out. She took some flu medication and it has made her feel funny. She states she also smoked some pot today

## 2016-12-20 NOTE — Discharge Instructions (Signed)
Drink plenty of fluids and rest at home for 1-2 days.  Follow-up with your doctor this week if not improving

## 2016-12-20 NOTE — ED Provider Notes (Signed)
[facility] EMERGENCY DEPARTMENT Provider Note   CSN: [id number] Arrival date & time: 12/20/16  1352     History   Chief Complaint Chief Complaint  Patient presents with  . Loss of Consciousness    HPI [name] is a 51 y.o. female.  Patient states that she felt weak bad and took some flu medicine and then smoked some pot and then passed out.  Patient feels weak now but otherwise has no fever cough   The history is provided by the patient.  Loss of Consciousness   This is a new problem. The current episode started less than 1 hour ago. The problem occurs rarely. The problem has been resolved. She lost consciousness for a period of 1 to 5 minutes. The problem is associated with normal activity. Associated symptoms include dizziness and visual change. Pertinent negatives include abdominal pain, back pain, chest pain, congestion, headaches and seizures. She has tried nothing for the symptoms. The treatment provided no relief.    Past Medical History:  Diagnosis Date  . Asthma   . Bipolar disease, chronic ([place])   . Cancer (HCC)    cervical cancer. partial hysterectomy in 20s.   . Cervical cancer ([place])   . COPD (chronic obstructive pulmonary disease) ([place])   . DDD (degenerative disc disease)   . Diabetes mellitus    not on medication  . Factor 5 Leiden mutation, heterozygous ([place])   . Hep C w/o coma, chronic ([place])   . Hypertension   . 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 Pneumonia   . Seizures [facility])     Patient Active Problem List   Diagnosis Date Noted  . Constipation 03/27/2016    Past Surgical History:  Procedure Laterality Date  . ABDOMINAL HYSTERECTOMY     cervical cancer  . BACK SURGERY    . carpel tunnel    . COLONOSCOPY  20 yrs     . MASS EXCISION      OB History    No data available       Home Medications    Prior to Admission medications   Medication Sig Start Date End Date Taking? Authorizing  Provider  albuterol (PROVENTIL HFA;VENTOLIN HFA) 108 (90 BASE) MCG/ACT inhaler Inhale 2 puffs into the lungs every 6 (six) hours as needed. For shortness of breath    Yes [provider]  beclomethasone (QVAR) 40 MCG/ACT inhaler Inhale 2 puffs into the lungs 2 (two) times daily.   Yes [provider]  DM-Doxylamine-Acetaminophen (NYQUIL COLD & FLU PO) Take 1 capsule by mouth at bedtime as needed.   Yes [provider]  gabapentin (NEURONTIN) 300 MG capsule Take 1,200 mg by mouth 2 (two) times daily.    Yes [provider]  hydrochlorothiazide (HYDRODIURIL) 25 MG tablet Take 25 mg by mouth daily.     Yes [provider]  levalbuterol (XOPENEX) 1.25 MG/3ML nebulizer solution Take 1.25 mg by nebulization every 4 (four) hours as needed for wheezing or shortness of breath.   Yes [provider]  loratadine (CLARITIN) 10 MG tablet Take 10 mg by mouth daily.     Yes [provider]  mupirocin ointment (BACTROBAN) 2 % Apply 1 application daily as needed topically (for open sores to prevent MRSA).   Yes [provider]  omeprazole (PRILOSEC) 20 MG capsule Take 1 capsule (20 mg total) by mouth daily. 10/09/15  Yes Mahala Menghini, PA-C  Potassium Chloride ER 20 MEQ  TBCR Take 20 mEq daily by mouth.    Yes [provider]  Pseudoephedrine-APAP-DM (DAYQUIL MULTI-SYMPTOM COLD/FLU PO) Take 1 capsule by mouth daily as needed.   Yes [provider]  traMADol (ULTRAM) 50 MG tablet Take 50 mg by mouth every 12 (twelve) hours as needed for moderate pain.   Yes [provider]    Family History Family History  Problem Relation Age of Onset  . Diabetes Mother   . Cancer Mother        breast   . Cancer Other   . Colon cancer Neg Hx   . Liver disease Neg Hx     Social History Social History   Tobacco Use  . Smoking status: Current Every Day Smoker    Packs/day: 1.00    Years: 37.00    Pack years: 37.00     Types: Cigarettes  . Smokeless tobacco: Never Used  Substance Use Topics  . Alcohol use: No  . Drug use: Yes    Types: Marijuana    Comment: remote IV drugs. last intranasal drugs 04/2015     Allergies   Cephalexin; Abilify [aripiprazole]; Augmentin [amoxicillin-pot clavulanate]; Keflex [cephalexin]; Penicillins; Seroquel [quetiapine fumarate]; Aspirin; and Sulfa antibiotics   Review of Systems Review of Systems  Constitutional: Negative for appetite change and fatigue.  HENT: Negative for congestion, ear discharge and sinus pressure.   Eyes: Negative for discharge.  Respiratory: Negative for cough.   Cardiovascular: Positive for syncope. Negative for chest pain.  Gastrointestinal: Negative for abdominal pain and diarrhea.  Genitourinary: Negative for frequency and hematuria.  Musculoskeletal: Negative for back pain.  Skin: Negative for rash.  Neurological: Positive for dizziness. Negative for seizures and headaches.  Psychiatric/Behavioral: Negative for hallucinations.     Physical Exam Updated Vital Signs BP 106/64   Pulse 98   Temp 98.3 F (36.8 C) (Oral)   Resp 16   Ht 5\' 1"  (1.549 m)   Wt 79.4 kg (175 lb)   SpO2 99%   BMI 33.07 kg/m   Physical Exam  Constitutional: She is oriented to person, place, and time. She appears well-developed.  HENT:  Head: Normocephalic.  Eyes: Conjunctivae and EOM are normal. No scleral icterus.  Neck: Neck supple. No thyromegaly present.  Cardiovascular: Normal rate and regular rhythm. Exam reveals no gallop and no friction rub.  No murmur heard. Pulmonary/Chest: No stridor. She has no wheezes. She has no rales. She exhibits no tenderness.  Abdominal: She exhibits no distension. There is no tenderness. There is no rebound.  Musculoskeletal: Normal range of motion. She exhibits no edema.  Lymphadenopathy:    She has no cervical adenopathy.  Neurological: She is oriented to person, place, and time. She exhibits normal muscle tone.  Coordination normal.  Skin: No rash noted. No erythema.  Psychiatric: She has a normal mood and affect. Her behavior is normal.     ED Treatments / Results  Labs (all labs ordered are listed, but only abnormal results are displayed) Labs Reviewed  CBC WITH DIFFERENTIAL/PLATELET - Abnormal; Notable for the following components:      Result Value   Hemoglobin 15.3 (*)    All other components within normal limits  COMPREHENSIVE METABOLIC PANEL - Abnormal; Notable for the following components:   Glucose, Bld 121 (*)    All other components within normal limits  URINALYSIS, ROUTINE W REFLEX MICROSCOPIC - Abnormal; Notable for the following components:   Squamous Epithelial / LPF 0-5 (*)    All other  components within normal limits  TROPONIN I    EKG  EKG Interpretation  Date/Time:  Saturday December 20 2016 14:04:27 EST Ventricular Rate:  107 PR Interval:    QRS Duration: 87 QT Interval:  311 QTC Calculation: 415 R Axis:   79 Text Interpretation:  Sinus tachycardia Baseline wander in lead(s) V3 Confirmed by Milton Ferguson [id number]) on 12/20/2016 5:35:22 PM       Radiology Dg Chest 2 View  Result Date: 12/20/2016 CLINICAL DATA:  Weakness, syncope EXAM: CHEST  2 VIEW COMPARISON:  11/04/2016 FINDINGS: Scarring in the lung bases. No acute airspace opacities or effusions. Heart is normal size. No acute bony abnormality. IMPRESSION: No active cardiopulmonary disease. Electronically Signed   By: Rolm Baptise M.D.   On: 12/20/2016 15:41    Procedures Procedures (including critical care time)  Medications Ordered in ED Medications  sodium chloride 0.9 % bolus 1,000 mL (1,000 mLs Intravenous New Bag/Given 12/20/16 1504)     Initial Impression / Assessment and Plan / ED Course  I have reviewed the triage vital signs and the nursing notes.  Pertinent labs & imaging results that were available during my care of the patient were reviewed by me and considered in my medical decision  making (see chart for details).     Patient with a syncopal episode.  Labs unremarkable.  Patient feeling better and will be discharged home.  Suspect viral syndrome  Final Clinical Impressions(s) / ED Diagnoses   Final diagnoses:  Syncope and collapse    ED Discharge Orders    None       Milton Ferguson, MD 12/20/16 1755

## 2016-12-22 ENCOUNTER — Encounter (HOSPITAL_COMMUNITY): Payer: Self-pay | Admitting: Emergency Medicine

## 2016-12-22 ENCOUNTER — Other Ambulatory Visit: Payer: Self-pay

## 2016-12-22 ENCOUNTER — Emergency Department (HOSPITAL_COMMUNITY)
Admission: EM | Admit: 2016-12-22 | Discharge: 2016-12-22 | Discharge status: Against Medical Advice | Payer: Medicaid Other | Attending: Emergency Medicine | Admitting: Emergency Medicine

## 2016-12-22 ENCOUNTER — Emergency Department (HOSPITAL_COMMUNITY): Payer: Medicaid Other

## 2016-12-22 DIAGNOSIS — R55 Syncope and collapse: Secondary | ICD-10-CM | POA: Insufficient documentation

## 2016-12-22 DIAGNOSIS — Z5321 Procedure and treatment not carried out due to patient leaving prior to being seen by health care provider: Secondary | ICD-10-CM | POA: Insufficient documentation

## 2016-12-22 LAB — CBG MONITORING, ED: GLUCOSE-CAPILLARY: 92 mg/dL (ref 65–99)

## 2016-12-22 NOTE — ED Triage Notes (Signed)
Pt states she felt like she almost passed out in [employer] [place].  States she also feels week and is having cp under left breast

## 2016-12-22 NOTE — ED Triage Notes (Signed)
Pt being called to have labs drawn and chest xray performed multiple times. No answer. Pt assumed to have left prior to being seen.

## 2016-12-22 NOTE — ED Notes (Signed)
Pt states she smoked pot last night

## 2017-05-20 ENCOUNTER — Ambulatory Visit (INDEPENDENT_AMBULATORY_CARE_PROVIDER_SITE_OTHER): Payer: Medicaid Other | Admitting: Adult Health

## 2017-05-20 ENCOUNTER — Encounter: Payer: Self-pay | Admitting: Adult Health

## 2017-05-20 ENCOUNTER — Other Ambulatory Visit (HOSPITAL_COMMUNITY)
Admission: RE | Admit: 2017-05-20 | Discharge: 2017-05-20 | Disposition: A | Discharge status: Home / Self Care | Payer: Medicaid Other | Source: Ambulatory Visit | Attending: Adult Health | Admitting: Adult Health

## 2017-05-20 VITALS — BP 120/70 | HR 97 | Ht 60.0 in | Wt 171.5 lb

## 2017-05-20 DIAGNOSIS — Z1212 Encounter for screening for malignant neoplasm of rectum: Secondary | ICD-10-CM | POA: Diagnosis present

## 2017-05-20 DIAGNOSIS — Z08 Encounter for follow-up examination after completed treatment for malignant neoplasm: Secondary | ICD-10-CM

## 2017-05-20 DIAGNOSIS — Z01419 Encounter for gynecological examination (general) (routine) without abnormal findings: Secondary | ICD-10-CM | POA: Diagnosis not present

## 2017-05-20 DIAGNOSIS — Z1211 Encounter for screening for malignant neoplasm of colon: Secondary | ICD-10-CM | POA: Diagnosis not present

## 2017-05-20 DIAGNOSIS — Z90710 Acquired absence of both cervix and uterus: Secondary | ICD-10-CM

## 2017-05-20 LAB — HEMOCCULT GUIAC POC 1CARD (OFFICE): Fecal Occult Blood, POC: NEGATIVE

## 2017-05-20 NOTE — Progress Notes (Signed)
  Subjective:     Patient ID: [name], female   DOB: [birth year], 52 y.o.   MRN: [id number]  HPI [name] is a 52 year old white female, single sp hysterectomy about 1991 for cervical cancer, in for pap smear. PCP is Dr Legrand Rams, who does her physical and labs.  Review of Systems Patient denies any headaches, hearing loss, fatigue, blurred vision, shortness of breath, chest pain, abdominal pain, problems with bowel movements, urination, or intercourse(not currently active). No joint pain or mood swings. Reviewed past medical,surgical, social and family history. Reviewed medications and allergies.     Objective:   Physical Exam BP 120/70 (BP Location: Left Arm, Patient Position: Sitting, Cuff Size: Normal)   Pulse 97   Ht 5' (1.524 m)   Wt 171 lb 8 oz (77.8 kg)   BMI 33.49 kg/m   PHQ 2 score 0. Skin warm and dry.Pelvic: external genitalia is normal in appearance no lesions, vagina: pale with loss of moisture and rugae,urethra has no lesions or masses noted, cervix and uterus are absent,pap with HPV and GC/CHL performed,adnexa: no masses or tenderness noted. Bladder is non tender and no masses felt,on rectal exam, has good tone, no masses felt, and hemoccult is negative.     Assessment:     1. Well woman exam with routine gynecological exam   2. Vaginal Pap smear following hysterectomy for malignancy   3. Screening for colorectal cancer       Plan:     Pap with HPV and GC/CHL sent Pap in 3 years if normal

## 2017-05-22 ENCOUNTER — Other Ambulatory Visit: Payer: Self-pay | Admitting: Physician Assistant

## 2017-05-22 LAB — CYTOLOGY - PAP
CHLAMYDIA, DNA PROBE: NEGATIVE
Diagnosis: NEGATIVE
HPV: DETECTED — AB
NEISSERIA GONORRHEA: NEGATIVE

## 2017-05-27 ENCOUNTER — Telehealth: Payer: Self-pay | Admitting: Adult Health

## 2017-05-27 NOTE — Telephone Encounter (Signed)
Pt aware pap is negative for malignancy and GC/CHL but +HPV, discussed with Dr Glo Herring and he says treat as normal.pt requests pap in 3 years

## 2018-02-01 IMAGING — DX DG CHEST 2V
2 series · 2 of 2 positions shown · non-contrast
Comparison: 11/04/2016

CLINICAL DATA: Weakness, syncope

EXAM:
CHEST  2 VIEW

[chest lat]
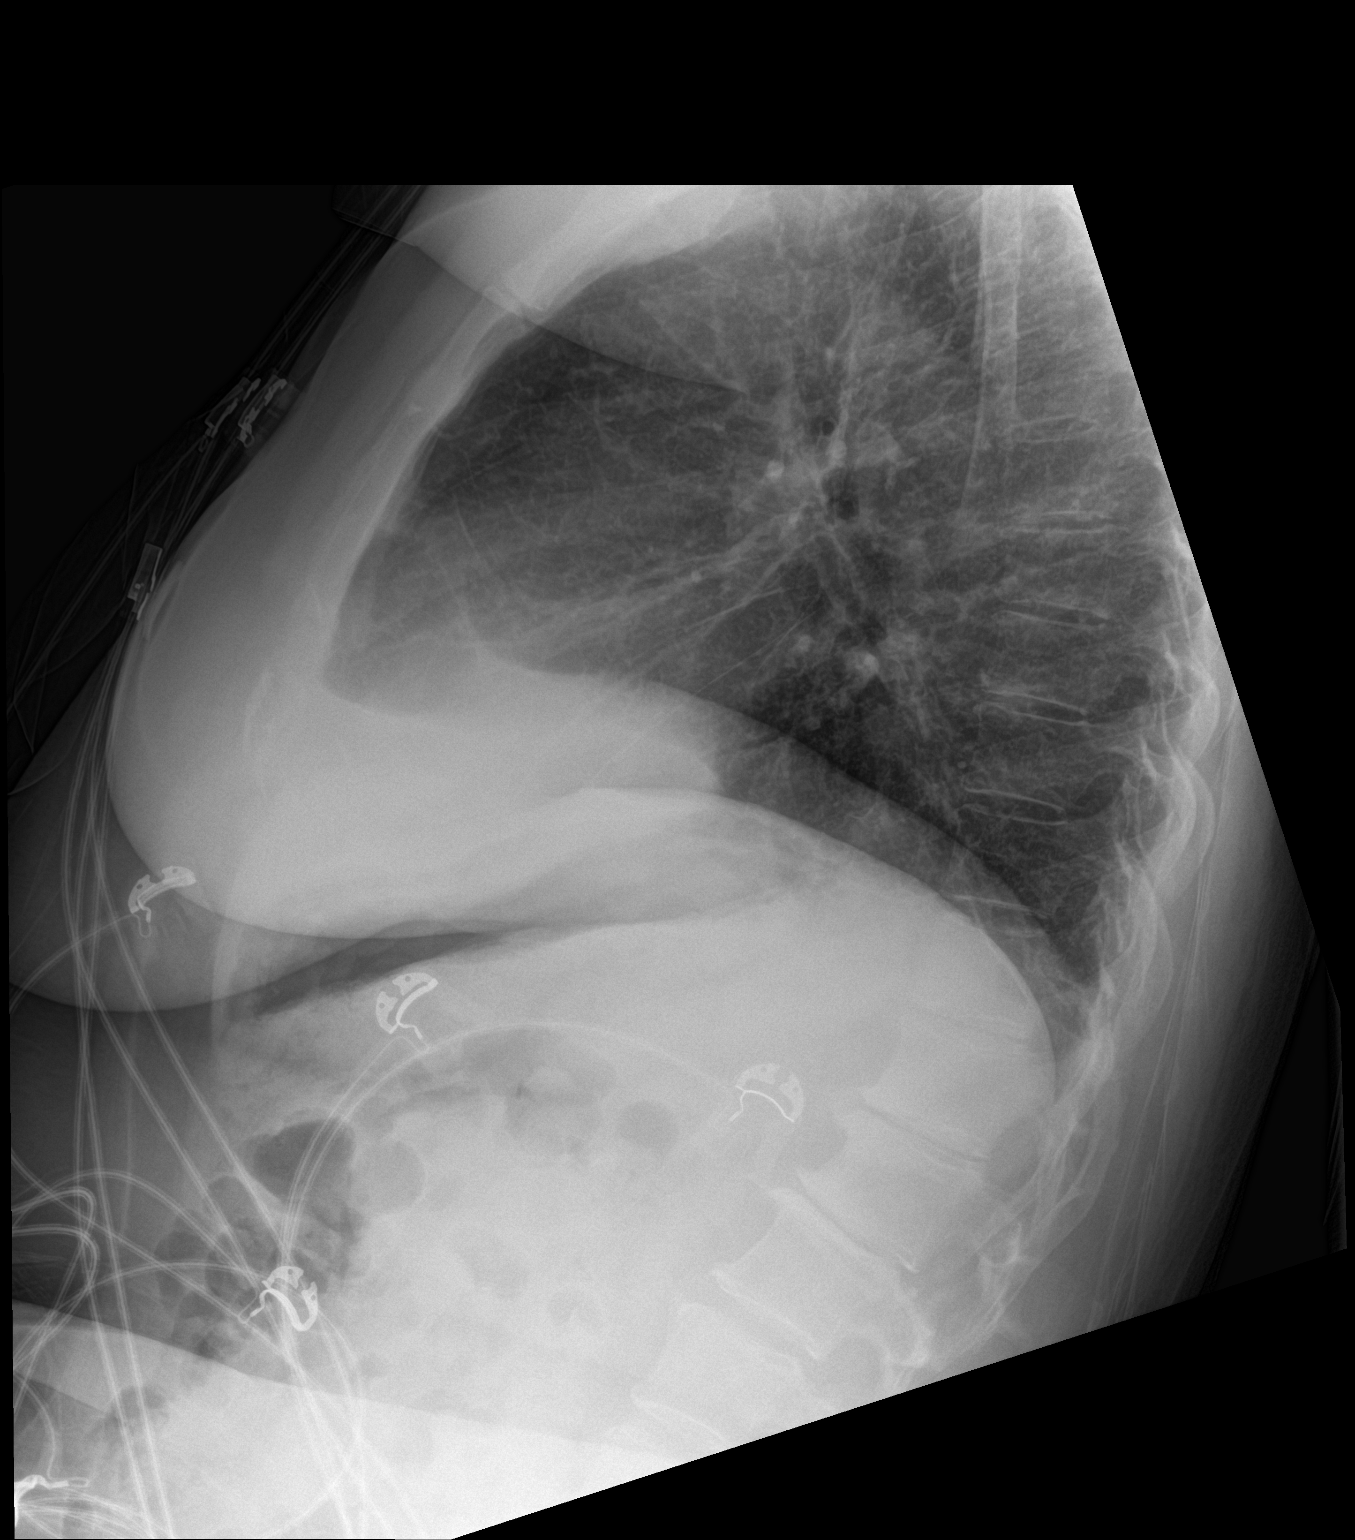

[chest ap]
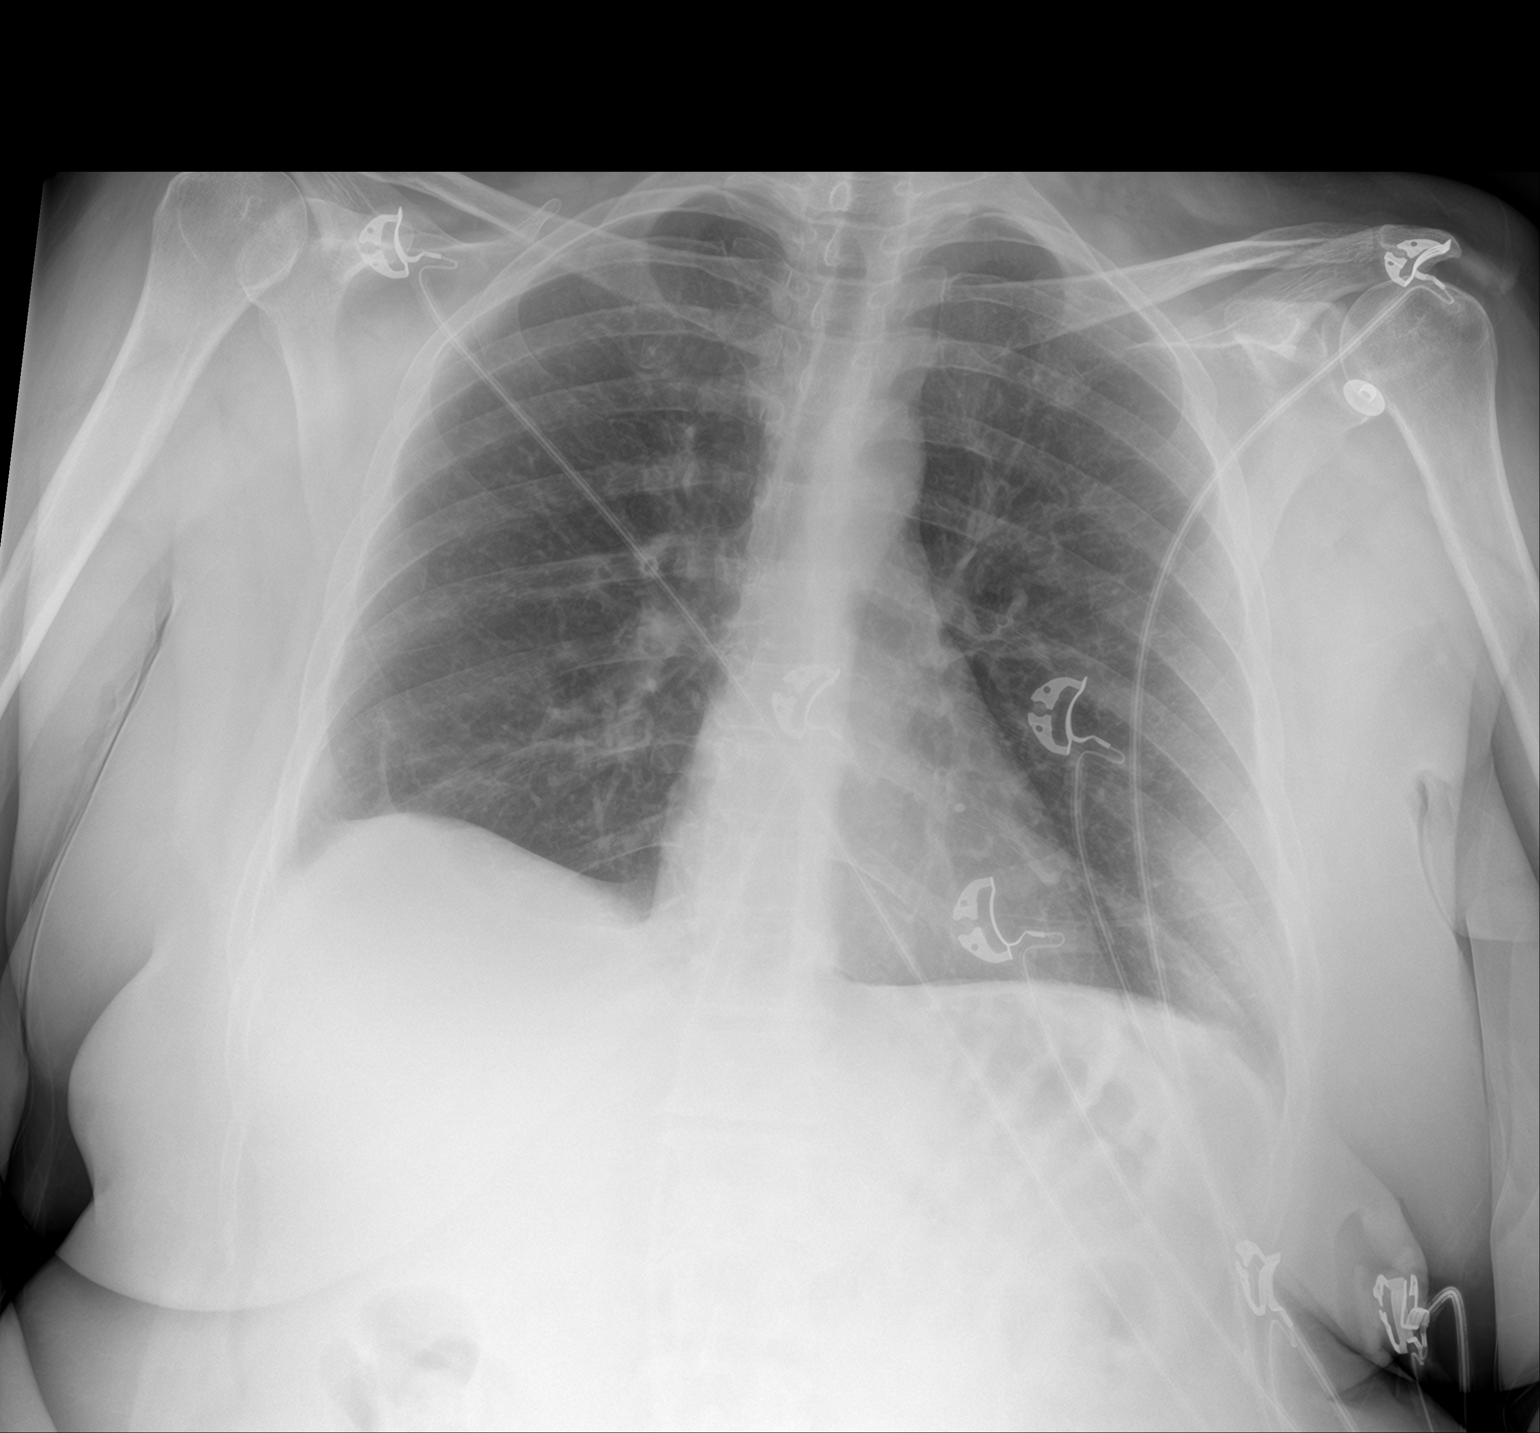

[2 of 2 positions shown; findings below may reference images not displayed]

FINDINGS: Scarring in the lung bases. No acute airspace opacities or
effusions. Heart is normal size. No acute bony abnormality.
IMPRESSION: No active cardiopulmonary disease.

## 2018-02-18 ENCOUNTER — Emergency Department (HOSPITAL_COMMUNITY)
Admission: EM | Admit: 2018-02-18 | Discharge: 2018-02-18 | Disposition: A | Discharge status: Home / Self Care | Payer: Medicaid Other | Attending: Emergency Medicine | Admitting: Emergency Medicine

## 2018-02-18 ENCOUNTER — Encounter (HOSPITAL_COMMUNITY): Payer: Self-pay | Admitting: Emergency Medicine

## 2018-02-18 ENCOUNTER — Emergency Department (HOSPITAL_COMMUNITY): Payer: Medicaid Other

## 2018-02-18 ENCOUNTER — Other Ambulatory Visit: Payer: Self-pay

## 2018-02-18 DIAGNOSIS — M25561 Pain in right knee: Secondary | ICD-10-CM | POA: Diagnosis present

## 2018-02-18 DIAGNOSIS — Z79899 Other long term (current) drug therapy: Secondary | ICD-10-CM | POA: Insufficient documentation

## 2018-02-18 DIAGNOSIS — I10 Essential (primary) hypertension: Secondary | ICD-10-CM | POA: Diagnosis not present

## 2018-02-18 DIAGNOSIS — J45909 Unspecified asthma, uncomplicated: Secondary | ICD-10-CM | POA: Diagnosis not present

## 2018-02-18 DIAGNOSIS — M25461 Effusion, right knee: Secondary | ICD-10-CM | POA: Diagnosis not present

## 2018-02-18 DIAGNOSIS — J449 Chronic obstructive pulmonary disease, unspecified: Secondary | ICD-10-CM | POA: Insufficient documentation

## 2018-02-18 DIAGNOSIS — E119 Type 2 diabetes mellitus without complications: Secondary | ICD-10-CM | POA: Diagnosis not present

## 2018-02-18 DIAGNOSIS — F17210 Nicotine dependence, cigarettes, uncomplicated: Secondary | ICD-10-CM | POA: Diagnosis not present

## 2018-02-18 MED ORDER — IBUPROFEN 600 MG PO TABS: 600.0000 mg | ORAL_TABLET | Freq: Four times a day (QID) | ORAL | 0 refills | Status: DC | PRN

## 2018-02-18 MED ORDER — KETOROLAC TROMETHAMINE 30 MG/ML IJ SOLN
15.0000 mg | Freq: Once | INTRAMUSCULAR | Status: AC
  Administered 2018-02-18: 15 mg via INTRAMUSCULAR

## 2018-02-18 MED ORDER — KETOROLAC TROMETHAMINE 30 MG/ML IJ SOLN
15.0000 mg | Freq: Once | INTRAMUSCULAR | Status: DC
  Filled 2018-02-18: qty 1

## 2018-02-18 NOTE — ED Notes (Signed)
Pt refused crutches, states she has them at home.

## 2018-02-18 NOTE — ED Provider Notes (Signed)
[facility] EMERGENCY DEPARTMENT Provider Note   CSN: [id number] Arrival date & time: 02/18/18  1801     History   Chief Complaint Chief Complaint  Patient presents with  . Fall    HPI [name] is a 53 y.o. female with a history as outlined below, most significant for hypertension, asthma, diet-controlled diabetes and a distant history of narcotic abuse presenting with a fall which occurred an hour before arrival.  She was walking down her outside steps which involves 3 steps when she slipped in the rain and developed pain in her right hip knee and down her right tibia.  She actually landed on her feet, however the pain was very intense initially at her knee that she was unable to bear weight so had a controlled fall to the ground.  She denies any other injuries including head injury, LOC, and denies weakness or numbness in her right lower extremity.  She has had no treatments prior to arrival.  Her pain is constant but worse with attempts at weightbearing.  She reports having a prior partial ACL tear in the same knee, but she deferred surgical intervention.  Her orthopedist is Dr. Aline Brochure.  The history is provided by the patient.    Past Medical History:  Diagnosis Date  . Asthma   . Bipolar disease, chronic ([place])   . Cancer (HCC)    cervical cancer. partial hysterectomy in 20s.   . Cervical cancer ([place])   . COPD (chronic obstructive pulmonary disease) ([place])   . DDD (degenerative disc disease)   . Diabetes mellitus    not on medication  . Factor 5 Leiden mutation, heterozygous ([place])   . Hep C w/o coma, chronic ([place])   . Hypertension   . 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 Pneumonia   . Seizures [facility])     Patient Active Problem List   Diagnosis Date Noted  . Screening for colorectal cancer 05/20/2017  . Well woman exam with routine gynecological exam 05/20/2017  . Vaginal Pap smear following hysterectomy for malignancy 05/20/2017  .  Constipation 03/27/2016    Past Surgical History:  Procedure Laterality Date  . ABDOMINAL HYSTERECTOMY     cervical cancer  . BACK SURGERY    . carpel tunnel    . COLONOSCOPY  20 yrs   [place]  . MASS EXCISION       OB History    Gravida  1   Para  1   Term  1   Preterm      AB      Living  1     SAB      TAB      Ectopic      Multiple      Live Births  1            Home Medications    Prior to Admission medications   Medication Sig Start Date End Date Taking? Authorizing Provider  Acetaminophen (TYLENOL PO) Take by mouth as needed.    [provider]  albuterol (PROVENTIL HFA;VENTOLIN HFA) 108 (90 BASE) MCG/ACT inhaler Inhale 2 puffs into the lungs every 6 (six) hours as needed. For shortness of breath     [provider]  beclomethasone (QVAR) 40 MCG/ACT inhaler Inhale 2 puffs into the lungs as needed.     [provider]  gabapentin (NEURONTIN) 300 MG capsule Take 1,200 mg by mouth 2 (two) times daily.  [provider]  hydrochlorothiazide (HYDRODIURIL) 25 MG tablet Take 25 mg by mouth daily.      [provider]  ibuprofen (ADVIL,MOTRIN) 600 MG tablet Take 1 tablet (600 mg total) by mouth every 6 (six) hours as needed. 02/18/18   Evalee Jefferson, PA-C  levalbuterol (XOPENEX) 1.25 MG/3ML nebulizer solution Take 1.25 mg by nebulization every 4 (four) hours as needed for wheezing or shortness of breath.    [provider]  loratadine (CLARITIN) 10 MG tablet Take 10 mg by mouth daily.      [provider]  mupirocin ointment (BACTROBAN) 2 % Apply 1 application daily as needed topically (for open sores to prevent MRSA).    [provider]  omeprazole (PRILOSEC) 20 MG capsule Take 1 capsule (20 mg total) by mouth daily. 10/09/15   Mahala Menghini, PA-C  Potassium Chloride ER 20 MEQ TBCR Take 20 mEq daily by mouth.     [provider]  traMADol (ULTRAM) 50 MG tablet Take 50 mg by  mouth every 12 (twelve) hours as needed for moderate pain.    [provider]    Family History Family History  Problem Relation Age of Onset  . Diabetes Mother   . Cancer Mother        breast   . Hypertension Mother   . Cancer Other   . Cancer Paternal Grandfather   . Cancer Maternal Grandmother   . Diabetes Maternal Grandfather   . Other Father        brain tumor, fluid around heart  . Factor V Leiden deficiency Father   . Drug abuse Sister   . Other Brother        MVA  . Colon cancer Neg Hx   . Liver disease Neg Hx     Social History Social History   Tobacco Use  . Smoking status: Current Every Day Smoker    Packs/day: 1.00    Years: 40.00    Pack years: 40.00    Types: Cigarettes  . Smokeless tobacco: Never Used  Substance Use Topics  . Alcohol use: No  . Drug use: Yes    Types: Marijuana    Comment: twice a week     Allergies   Cephalexin; Celecoxib; Imipramine hcl; Other; Abilify [aripiprazole]; Augmentin [amoxicillin-pot clavulanate]; Keflex [cephalexin]; Penicillins; Seroquel [quetiapine fumarate]; Aspirin; and Sulfa antibiotics   Review of Systems Review of Systems  Constitutional: Negative for fever.  Musculoskeletal: Positive for arthralgias and joint swelling. Negative for myalgias.  Neurological: Negative for weakness and numbness.     Physical Exam Updated Vital Signs BP 119/85 (BP Location: Right Arm)   Pulse 86   Temp 98.1 F (36.7 C) (Oral)   Resp 16   Ht 5\' 1"  (1.549 m)   Wt 79.4 kg   SpO2 97%   BMI 33.07 kg/m   Physical Exam Vitals signs and nursing note reviewed.  Constitutional:      Appearance: She is well-developed.  HENT:     Head: Atraumatic.  Neck:     Musculoskeletal: Normal range of motion.  Cardiovascular:     Pulses:          Dorsalis pedis pulses are 2+ on the right side and 2+ on the left side.     Comments: Pulses equal bilaterally Musculoskeletal:        General: Swelling and tenderness present.      Right hip: She exhibits decreased range of motion and tenderness. She exhibits no  swelling, no crepitus and no deformity.     Right knee: She exhibits swelling, effusion and bony tenderness. She exhibits no ecchymosis, no deformity, no erythema, normal alignment, no LCL laxity and no MCL laxity. Tenderness found. Lateral joint line tenderness noted. No patellar tendon tenderness noted.       Legs:     Comments: There is tenderness to palpation down the entire right anterior tibia to the ankle.  There is no palpable deformity or edema.  No bruising.  Patient can straight leg raise keeping the knee joint in extension without weakness or collapse.  Skin:    General: Skin is warm and dry.  Neurological:     Mental Status: She is alert.     Sensory: No sensory deficit.     Deep Tendon Reflexes: Reflexes normal.      ED Treatments / Results  Labs (all labs ordered are listed, but only abnormal results are displayed) Labs Reviewed - No data to display  EKG None  Radiology Dg Tibia/fibula Right  Result Date: 02/18/2018 CLINICAL DATA:  Slipped and fell today. EXAM: RIGHT TIBIA AND FIBULA - 2 VIEW COMPARISON:  MRI 2016 FINDINGS: The knee and ankle joints are maintained. Remote healed proximal fibular shaft fracture. No acute fracture of the tibia or fibula is identified. Calcification noted at the Achilles attachment site. IMPRESSION: Remote healed fracture involving the proximal fibular shaft. No acute fracture of the tibia or fibula. Electronically Signed   By: [name] Sanes M.D.   On: 02/18/2018 19:40   Dg Knee Complete 4 Views Right  Result Date: 02/18/2018 CLINICAL DATA:  [place] and injured right knee today. EXAM: RIGHT KNEE - COMPLETE 4+ VIEW COMPARISON:  09/25/2012 FINDINGS: Mild/early tricompartmental degenerative changes with early joint space narrowing and spurring. Remote healed proximal fibular shaft fracture is again noted. No acute fracture or osteochondral lesion. There is a  moderate-sized suprapatellar knee joint effusion. IMPRESSION: 1. Mild degenerative changes but no acute bony findings. 2. Moderate-sized joint effusion. Electronically Signed   By: [name] Sanes M.D.   On: 02/18/2018 19:32   Dg Hip Unilat W Or W/o Pelvis 2-3 Views Right  Result Date: 02/18/2018 CLINICAL DATA:  [place].  Right hip pain. EXAM: DG HIP (WITH OR WITHOUT PELVIS) 2-3V RIGHT COMPARISON:  04/22/2010 FINDINGS: The hips are normally located. Mild protrusio is noted, left greater than right with over covering of both hips. Could be due to an underlying process such as rheumatoid arthritis. It is unchanged since 2012. No acute fracture or evidence of AVN. The pubic symphysis and SI joints are intact. No pelvic fractures. IMPRESSION: No acute bony findings. Electronically Signed   By: [name] Sanes M.D.   On: 02/18/2018 19:34    Procedures Procedures (including critical care time)  Medications Ordered in ED Medications  ketorolac (TORADOL) 30 MG/ML injection 15 mg (15 mg Intramuscular Given 02/18/18 1853)     Initial Impression / Assessment and Plan / ED Course  I have reviewed the triage vital signs and the nursing notes.  Pertinent labs & imaging results that were available during my care of the patient were reviewed by me and considered in my medical decision making (see chart for details).     Imaging reviewed and discussed with patient.  She has a moderate right knee effusion suggesting meniscal or ligament injury.  She was placed in a knee immobilizer, crutches were also offered.  She will need follow-up care with orthopedics and was advised to call Dr. Aline Brochure  for further evaluation and management of this injury. Advised ice, elevation, nsaids prescribed.  Final Clinical Impressions(s) / ED Diagnoses   Final diagnoses:  Knee effusion, right    ED Discharge Orders         Ordered    ibuprofen (ADVIL,MOTRIN) 600 MG tablet  Every 6 hours PRN     02/18/18 1954             Landis Martins 02/18/18 1954    Milton Ferguson, MD 02/18/18 2209

## 2018-02-18 NOTE — ED Notes (Signed)
Pt returned from xray

## 2018-02-18 NOTE — Discharge Instructions (Signed)
As discussed, you have no bony injuries based on your x-rays today, but you do have a moderate fluid collection in your right knee joint which can imply a ligament or cartilage injury.  Rest the joint is much as possible, minimize bending of the knee, use the knee immobilizer and crutches for assistance resting this joint.  Ice also is much as possible.  Call Dr. Aline Brochure for further evaluation and management of this injury.

## 2018-02-18 NOTE — ED Triage Notes (Signed)
Patient reports slipping on wet steps and injuring her R leg. Pain from mid calf to ankle. Patient reports soft swelling to the back of her knee. Has torn ACL from previous fall.

## 2018-02-19 ENCOUNTER — Telehealth: Payer: Self-pay | Admitting: Orthopedic Surgery

## 2018-02-19 NOTE — Telephone Encounter (Signed)
Patient's daughter called and stated Waynette went to ED due to knee pain.  She was told to follow up with Dr. Aline Brochure.  I told her that Dr. Aline Brochure was not in the office this morning.  She wanted to know what they should do because the knee was swollen and she was hurting.  I told her to call T[place]ney's PCP to get further advice and/or to get a referral for her to be seen here.  She said they would do this.

## 2018-02-24 ENCOUNTER — Ambulatory Visit (INDEPENDENT_AMBULATORY_CARE_PROVIDER_SITE_OTHER): Payer: Medicaid Other | Admitting: Orthopedic Surgery

## 2018-02-24 ENCOUNTER — Encounter: Payer: Self-pay | Admitting: Orthopedic Surgery

## 2018-02-24 VITALS — BP 141/97 | HR 91 | Ht 61.0 in | Wt 186.0 lb

## 2018-02-24 DIAGNOSIS — S8391XA Sprain of unspecified site of right knee, initial encounter: Secondary | ICD-10-CM

## 2018-02-24 DIAGNOSIS — M25561 Pain in right knee: Secondary | ICD-10-CM

## 2018-02-24 NOTE — Patient Instructions (Signed)
Ice daily 2-3 times a day  Continue bending the knee  Light activity  Come back in 1 month

## 2018-02-24 NOTE — Progress Notes (Signed)
New problem //OFFICE VISIT  Chief Complaint  Patient presents with  . Knee Pain    right     53 year old female presents for evaluation of her right knee  On February 13 she fell injured her right knee went to the ER x-rays were negative she complains of pain and swelling over the right knee with pain in the posterior portion of the knee joint best described as a dull ache.  Pain is improving quality dull swelling is down     Review of Systems  Constitutional: Negative.   Skin: Negative.   Neurological: Negative for tingling and focal weakness.     Past Medical History:  Diagnosis Date  . Asthma   . Bipolar disease, chronic ([place])   . Cancer (HCC)    cervical cancer. partial hysterectomy in 20s.   . Cervical cancer ([place])   . COPD (chronic obstructive pulmonary disease) ([place])   . DDD (degenerative disc disease)   . Diabetes mellitus    not on medication  . Factor 5 Leiden mutation, heterozygous ([place])   . Hep C w/o coma, chronic ([place])   . Hypertension   . 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 Pneumonia   . Seizures ([place])     Past Surgical History:  Procedure Laterality Date  . ABDOMINAL HYSTERECTOMY     cervical cancer  . BACK SURGERY    . carpel tunnel    . COLONOSCOPY  20 yrs   [place]  . MASS EXCISION      Family History  Problem Relation Age of Onset  . Diabetes Mother   . Cancer Mother        breast   . Hypertension Mother   . Cancer Other   . Cancer Paternal Grandfather   . Cancer Maternal Grandmother   . Diabetes Maternal Grandfather   . Other Father        brain tumor, fluid around heart  . Factor V Leiden deficiency Father   . Drug abuse Sister   . Other Brother        MVA  . Colon cancer Neg Hx   . Liver disease Neg Hx    Social History   Tobacco Use  . Smoking status: Current Every Day Smoker    Packs/day: 1.00    Years: 40.00    Pack years: 40.00    Types: Cigarettes  . Smokeless tobacco: Never Used   Substance Use Topics  . Alcohol use: No  . Drug use: Yes    Types: Marijuana    Comment: twice a week    Allergies  Allergen Reactions  . Cephalexin Shortness Of Breath  . Celecoxib Nausea And Vomiting  . Imipramine Hcl Other (See Comments)    Facial twisting  . Other Nausea And Vomiting    Muscle Relaxants  . Abilify [Aripiprazole] Nausea And Vomiting  . Augmentin [Amoxicillin-Pot Clavulanate] Nausea And Vomiting  . Keflex [Cephalexin]   . Penicillins Nausea And Vomiting    Has patient had a PCN reaction causing immediate rash, facial/tongue/throat swelling, SOB or lightheadedness with hypotension: Yes Has patient had a PCN reaction causing severe rash involving mucus membranes or skin necrosis: Yes Has patient had a PCN reaction that required hospitalization No Has patient had a PCN reaction occurring within the last 10 years: No If all of the above answers are "NO", then may proceed with Cephalosporin use.   . Seroquel [Quetiapine Fumarate]   . Aspirin Swelling and Rash  .  Sulfa Antibiotics Rash and Other (See Comments)    Triggers headaches also    Current Meds  Medication Sig  . Acetaminophen (TYLENOL PO) Take by mouth as needed.  Marland Kitchen albuterol (PROVENTIL HFA;VENTOLIN HFA) 108 (90 BASE) MCG/ACT inhaler Inhale 2 puffs into the lungs every 6 (six) hours as needed. For shortness of breath   . gabapentin (NEURONTIN) 300 MG capsule Take 1,200 mg by mouth 2 (two) times daily.   . hydrochlorothiazide (HYDRODIURIL) 25 MG tablet Take 25 mg by mouth daily.    Marland Kitchen ibuprofen (ADVIL,MOTRIN) 600 MG tablet Take 1 tablet (600 mg total) by mouth every 6 (six) hours as needed.  . loratadine (CLARITIN) 10 MG tablet Take 10 mg by mouth daily.    . mupirocin ointment (BACTROBAN) 2 % Apply 1 application daily as needed topically (for open sores to prevent MRSA).  Marland Kitchen omeprazole (PRILOSEC) 20 MG capsule Take 1 capsule (20 mg total) by mouth daily.  . Potassium Chloride ER 20 MEQ TBCR Take 20 mEq  daily by mouth.   . traMADol (ULTRAM) 50 MG tablet Take 50 mg by mouth every 12 (twelve) hours as needed for moderate pain.    BP (!) 141/97   Pulse 91   Ht 5\' 1"  (1.549 m)   Wt 186 lb (84.4 kg)   BMI 35.14 kg/m   Physical Exam Vitals signs reviewed.  Constitutional:      Appearance: Normal appearance. She is well-developed.  Neurological:     Mental Status: She is alert and oriented to person, place, and time.     Gait: Gait abnormal.  Psychiatric:        Attention and Perception: Attention normal.        Mood and Affect: Mood and affect normal.        Speech: Speech normal.        Behavior: Behavior normal.        Thought Content: Thought content normal.        Judgment: Judgment normal.     Right Knee Exam   Muscle Strength  The patient has normal right knee strength.  Tenderness  Right knee tenderness location: Popliteal fossa.  Range of Motion  Extension: abnormal  Flexion: abnormal   Tests  [place]:  Medial - negative Lateral - negative Varus: negative Valgus: negative Drawer:  Anterior - negative    Posterior - negative  Other  Erythema: absent Scars: absent Sensation: normal Pulse: present Swelling: none  Comments:  10 degree flexion contracture passive range of motion up to 115 degrees   Left Knee Exam   Muscle Strength  The patient has normal left knee strength.  Tenderness  The patient is experiencing no tenderness.   Range of Motion  Extension: normal  Flexion: normal   Tests  [place]ray:  Medial - negative Lateral - negative Varus: negative Valgus: negative Drawer:  Anterior - negative     Posterior - negative  Other  Erythema: absent Scars: absent Sensation: normal Pulse: present Swelling: none        MEDICAL DECISION SECTION  Xrays were done at [facility]  My independent reading of xrays:  4 views of the right knee mild degenerative changes normal alignment moderate effusion  AP pelvis AP lateral right hip no fracture  dislocation or bony abnormality  Encounter Diagnoses  Name Primary?  . Sprain of right knee, unspecified ligament, initial encounter Yes  . Acute pain of right knee     PLAN: (Rx., injectx, surgery, frx, mri/ct) Ration  injection  Ice  Range of motion exercises  Light activity  Follow-up 1 month  Procedure note injection and aspiration right knee joint  Verbal consent was obtained to aspirate and inject the right knee joint   Timeout was completed to confirm the site of aspiration and injection  An 18-gauge needle was used to aspirate the knee joint from a suprapatellar lateral approach.  The medications used were 40 mg of Depo-Medrol and 1% lidocaine 3 cc  Anesthesia was provided by ethyl chloride and the skin was prepped with alcohol.  After cleaning the skin with alcohol an 18-gauge needle was used to aspirate the right knee joint.  We obtained 30  cc of fluid the fluid was blood-tinged amber-colored We follow this by injection of 40 mg of Depo-Medrol and 3 cc 1% lidocaine.  There were no complications. A sterile bandage was applied.     No orders of the defined types were placed in this encounter.   Arther Abbott, MD  02/24/2018 2:42 PM

## 2018-03-24 ENCOUNTER — Ambulatory Visit: Payer: Medicaid Other | Admitting: Orthopedic Surgery

## 2018-06-11 ENCOUNTER — Encounter (INDEPENDENT_AMBULATORY_CARE_PROVIDER_SITE_OTHER): Payer: Self-pay

## 2018-10-15 ENCOUNTER — Other Ambulatory Visit: Payer: Self-pay

## 2018-10-15 DIAGNOSIS — Z20822 Contact with and (suspected) exposure to covid-19: Secondary | ICD-10-CM

## 2018-10-17 LAB — NOVEL CORONAVIRUS, NAA: SARS-CoV-2, NAA: NOT DETECTED

## 2018-10-18 ENCOUNTER — Telehealth: Payer: Self-pay | Admitting: General Practice

## 2018-10-18 NOTE — Telephone Encounter (Signed)
Negative COVID results given. Patient results "NOT Detected." Caller expressed understanding. ° °

## 2019-07-05 ENCOUNTER — Emergency Department (HOSPITAL_COMMUNITY)
Admission: EM | Admit: 2019-07-05 | Discharge: 2019-07-05 | Disposition: A | Discharge status: Home / Self Care | Payer: Medicaid Other | Attending: Emergency Medicine | Admitting: Emergency Medicine

## 2019-07-05 ENCOUNTER — Encounter (HOSPITAL_COMMUNITY): Payer: Self-pay | Admitting: Emergency Medicine

## 2019-07-05 ENCOUNTER — Other Ambulatory Visit: Payer: Self-pay

## 2019-07-05 DIAGNOSIS — Y999 Unspecified external cause status: Secondary | ICD-10-CM | POA: Diagnosis not present

## 2019-07-05 DIAGNOSIS — F17210 Nicotine dependence, cigarettes, uncomplicated: Secondary | ICD-10-CM | POA: Insufficient documentation

## 2019-07-05 DIAGNOSIS — S0181XA Laceration without foreign body of other part of head, initial encounter: Secondary | ICD-10-CM | POA: Insufficient documentation

## 2019-07-05 DIAGNOSIS — J449 Chronic obstructive pulmonary disease, unspecified: Secondary | ICD-10-CM | POA: Diagnosis not present

## 2019-07-05 DIAGNOSIS — Y9289 Other specified places as the place of occurrence of the external cause: Secondary | ICD-10-CM | POA: Insufficient documentation

## 2019-07-05 DIAGNOSIS — Y9389 Activity, other specified: Secondary | ICD-10-CM | POA: Diagnosis not present

## 2019-07-05 DIAGNOSIS — W228XXA Striking against or struck by other objects, initial encounter: Secondary | ICD-10-CM | POA: Insufficient documentation

## 2019-07-05 DIAGNOSIS — D6851 Activated protein C resistance: Secondary | ICD-10-CM | POA: Insufficient documentation

## 2019-07-05 DIAGNOSIS — S0990XA Unspecified injury of head, initial encounter: Secondary | ICD-10-CM

## 2019-07-05 NOTE — ED Triage Notes (Signed)
Pt with factor 5   Catching her chickens and stepped on a broom which made the handle hit her in the head   Small lac to L parietal area   No loc- but "almost did"  Here for eval

## 2019-07-05 NOTE — ED Provider Notes (Signed)
[facility] EMERGENCY DEPARTMENT Provider Note   CSN: [id number] Arrival date & time: 07/05/19  2102     History Chief Complaint  Patient presents with  . Laceration    [name] is a 54 y.o. female.  Patient hit her head today but did not lose consciousness.  She was getting up and then hit her head  The history is provided by the patient. No language interpreter was used.  Laceration Location:  Head/neck Head/neck laceration location:  Head Depth:  Cutaneous Quality: jagged   Bleeding: controlled   Laceration mechanism:  Blunt object Pain details:    Quality:  Aching Associated symptoms: no rash        Past Medical History:  Diagnosis Date  . Asthma   . Bipolar disease, chronic ([place])   . Cancer (HCC)    cervical cancer. partial hysterectomy in 20s.   . Cervical cancer ([place])   . COPD (chronic obstructive pulmonary disease) ([place])   . DDD (degenerative disc disease)   . Diabetes mellitus    not on medication  . Factor 5 Leiden mutation, heterozygous ([place])   . Hep C w/o coma, chronic ([place])   . Hypertension   . 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 Pneumonia   . Seizures [facility])     Patient Active Problem List   Diagnosis Date Noted  . Screening for colorectal cancer 05/20/2017  . Well woman exam with routine gynecological exam 05/20/2017  . Vaginal Pap smear following hysterectomy for malignancy 05/20/2017  . Constipation 03/27/2016  . Right wrist pain 02/26/2016  . Factor V Leiden ([place]) 10/04/2013    Past Surgical History:  Procedure Laterality Date  . ABDOMINAL HYSTERECTOMY     cervical cancer  . BACK SURGERY    . carpel tunnel    . COLONOSCOPY  20 yrs   [place]  . MASS EXCISION       OB History    Gravida  1   Para  1   Term  1   Preterm      AB      Living  1     SAB      TAB      Ectopic      Multiple      Live Births  1           Family History  Problem Relation Age of Onset    . Diabetes Mother   . Cancer Mother        breast   . Hypertension Mother   . Cancer Other   . Cancer Paternal Grandfather   . Cancer Maternal Grandmother   . Diabetes Maternal Grandfather   . Other Father        brain tumor, fluid around heart  . Factor V Leiden deficiency Father   . Drug abuse Sister   . Other Brother        MVA  . Colon cancer Neg Hx   . Liver disease Neg Hx     Social History   Tobacco Use  . Smoking status: Current Every Day Smoker    Packs/day: 1.00    Years: 40.00    Pack years: 40.00    Types: Cigarettes  . Smokeless tobacco: Never Used  Vaping Use  . Vaping Use: Never used  Substance Use Topics  . Alcohol use: No  . Drug use: Yes    Types: Marijuana    Comment: twice a week  Home Medications Prior to Admission medications   Medication Sig Start Date End Date Taking? Authorizing Provider  Acetaminophen (TYLENOL PO) Take by mouth as needed.    [provider]  albuterol (PROVENTIL HFA;VENTOLIN HFA) 108 (90 BASE) MCG/ACT inhaler Inhale 2 puffs into the lungs every 6 (six) hours as needed. For shortness of breath     [provider]  beclomethasone (QVAR) 40 MCG/ACT inhaler Inhale 2 puffs into the lungs as needed.     [provider]  gabapentin (NEURONTIN) 300 MG capsule Take 1,200 mg by mouth 2 (two) times daily.     [provider]  hydrochlorothiazide (HYDRODIURIL) 25 MG tablet Take 25 mg by mouth daily.      [provider]  ibuprofen (ADVIL,MOTRIN) 600 MG tablet Take 1 tablet (600 mg total) by mouth every 6 (six) hours as needed. 02/18/18   Evalee Jefferson, PA-C  levalbuterol (XOPENEX) 1.25 MG/3ML nebulizer solution Take 1.25 mg by nebulization every 4 (four) hours as needed for wheezing or shortness of breath.    [provider]  loratadine (CLARITIN) 10 MG tablet Take 10 mg by mouth daily.      [provider]  mupirocin ointment (BACTROBAN) 2 % Apply 1 application daily as  needed topically (for open sores to prevent MRSA).    [provider]  omeprazole (PRILOSEC) 20 MG capsule Take 1 capsule (20 mg total) by mouth daily. 10/09/15   Mahala Menghini, PA-C  Potassium Chloride ER 20 MEQ TBCR Take 20 mEq daily by mouth.     [provider]  traMADol (ULTRAM) 50 MG tablet Take 50 mg by mouth every 12 (twelve) hours as needed for moderate pain.    [provider]    Allergies    Cephalexin, Celecoxib, Imipramine hcl, Other, Abilify [aripiprazole], Augmentin [amoxicillin-pot clavulanate], Keflex [cephalexin], Penicillins, Seroquel [quetiapine fumarate], Aspirin, and Sulfa antibiotics  Review of Systems   Review of Systems  Constitutional: Negative for appetite change and fatigue.  HENT: Negative for congestion, ear discharge and sinus pressure.   Eyes: Negative for discharge.  Respiratory: Negative for cough.   Cardiovascular: Negative for chest pain.  Gastrointestinal: Negative for abdominal pain and diarrhea.  Genitourinary: Negative for frequency and hematuria.  Musculoskeletal: Negative for back pain.  Skin: Negative for rash.  Neurological: Positive for headaches. Negative for seizures.  Psychiatric/Behavioral: Negative for hallucinations.    Physical Exam Updated Vital Signs BP (!) 146/93 (BP Location: Right Arm)   Pulse (!) 106   Temp (!) 97.1 F (36.2 C) (Oral)   Resp 18   Ht 5\' 2"  (1.575 m)   Wt 81.6 kg   SpO2 96%   BMI 32.92 kg/m   Physical Exam Vitals and nursing note reviewed.  Constitutional:      Appearance: She is well-developed.  HENT:     Head: Normocephalic.     Comments: 2 cm laceration to top of the head    Nose: Nose normal.  Eyes:     General: No scleral icterus.    Conjunctiva/sclera: Conjunctivae normal.  Neck:     Thyroid: No thyromegaly.  Cardiovascular:     Rate and Rhythm: Normal rate and regular rhythm.     Heart sounds: No murmur heard.  No friction rub. No gallop.   Pulmonary:      Breath sounds: No stridor. No wheezing or rales.  Chest:     Chest wall: No tenderness.  Abdominal:     General: There is no  distension.     Tenderness: There is no abdominal tenderness. There is no rebound.  Musculoskeletal:        General: Normal range of motion.     Cervical back: Neck supple.  Lymphadenopathy:     Cervical: No cervical adenopathy.  Skin:    Findings: No erythema or rash.  Neurological:     Mental Status: She is oriented to person, place, and time.     Motor: No abnormal muscle tone.     Coordination: Coordination normal.  Psychiatric:        Behavior: Behavior normal.     ED Results / Procedures / Treatments   Labs (all labs ordered are listed, but only abnormal results are displayed) Labs Reviewed - No data to display  EKG None  Radiology No results found.  Procedures .Marland KitchenLaceration Repair  Date/Time: 07/05/2019 10:54 PM Performed by: Milton Ferguson, MD Authorized by: Milton Ferguson, MD   Comments:     Patient with a 2 cm irregular laceration to the top of her head.  Area was cleaned thoroughly with Betadine and 3 staples were used to close the laceration.  The patient tolerated the procedure well   (including critical care time)  Medications Ordered in ED Medications - No data to display  ED Course  I have reviewed the triage vital signs and the nursing notes.  Pertinent labs & imaging results that were available during my care of the patient were reviewed by me and considered in my medical decision making (see chart for details).    MDM Rules/Calculators/A&P                          Patient with laceration to the top of the head.  It was stapled with 3 staples         This patient presents to the ED for concern of head injury, this involves an extensive number of treatment options, and is a complaint that carries with it a high risk of complications and morbidity.  The differential diagnosis includes laceration to head   Lab  Tests:   I Ordered, reviewed, and interpreted labs, which included no labs done  Medicines ordered:     Imaging Studies ordered:     Additional history obtained:   Additional history obtained from records  Previous records obtained and reviewed.  Consultations Obtained:  Reevaluation:  After the interventions stated above, I reevaluated the patient and found improved  Critical Interventions:  .   Final Clinical Impression(s) / ED Diagnoses Final diagnoses:  None    Rx / DC Orders ED Discharge Orders    None       Milton Ferguson, MD 07/05/19 2256

## 2019-07-05 NOTE — Discharge Instructions (Signed)
Have staples removed in 7 to 10 days and clean laceration twice a day with soap and water.  Return if any problems

## 2020-01-10 ENCOUNTER — Other Ambulatory Visit: Payer: Medicaid Other

## 2020-01-10 ENCOUNTER — Other Ambulatory Visit: Payer: Self-pay

## 2020-01-10 DIAGNOSIS — Z20822 Contact with and (suspected) exposure to covid-19: Secondary | ICD-10-CM

## 2020-01-12 LAB — NOVEL CORONAVIRUS, NAA: SARS-CoV-2, NAA: DETECTED — AB

## 2020-01-12 LAB — SARS-COV-2, NAA 2 DAY TAT

## 2020-04-03 ENCOUNTER — Encounter: Payer: Self-pay | Admitting: Internal Medicine

## 2020-05-10 ENCOUNTER — Ambulatory Visit: Payer: Medicaid Other | Admitting: Gastroenterology

## 2020-05-22 ENCOUNTER — Ambulatory Visit (INDEPENDENT_AMBULATORY_CARE_PROVIDER_SITE_OTHER): Payer: Medicaid Other | Admitting: Adult Health

## 2020-05-22 ENCOUNTER — Encounter: Payer: Self-pay | Admitting: Adult Health

## 2020-05-22 ENCOUNTER — Other Ambulatory Visit: Payer: Self-pay

## 2020-05-22 ENCOUNTER — Other Ambulatory Visit (HOSPITAL_COMMUNITY)
Admission: RE | Admit: 2020-05-22 | Discharge: 2020-05-22 | Disposition: A | Discharge status: Home / Self Care | Payer: Medicaid Other | Source: Ambulatory Visit | Attending: Adult Health | Admitting: Adult Health

## 2020-05-22 VITALS — BP 131/85 | HR 90 | Ht 60.0 in | Wt 179.0 lb

## 2020-05-22 DIAGNOSIS — F17210 Nicotine dependence, cigarettes, uncomplicated: Secondary | ICD-10-CM

## 2020-05-22 DIAGNOSIS — Z1211 Encounter for screening for malignant neoplasm of colon: Secondary | ICD-10-CM | POA: Insufficient documentation

## 2020-05-22 DIAGNOSIS — Z01419 Encounter for gynecological examination (general) (routine) without abnormal findings: Secondary | ICD-10-CM | POA: Insufficient documentation

## 2020-05-22 DIAGNOSIS — Z90710 Acquired absence of both cervix and uterus: Secondary | ICD-10-CM | POA: Insufficient documentation

## 2020-05-22 DIAGNOSIS — Z0000 Encounter for general adult medical examination without abnormal findings: Secondary | ICD-10-CM | POA: Diagnosis not present

## 2020-05-22 DIAGNOSIS — F17200 Nicotine dependence, unspecified, uncomplicated: Secondary | ICD-10-CM | POA: Diagnosis not present

## 2020-05-22 DIAGNOSIS — Z1231 Encounter for screening mammogram for malignant neoplasm of breast: Secondary | ICD-10-CM | POA: Insufficient documentation

## 2020-05-22 LAB — HEMOCCULT GUIAC POC 1CARD (OFFICE): Fecal Occult Blood, POC: NEGATIVE

## 2020-05-22 NOTE — Progress Notes (Signed)
Patient ID: [name], female   DOB: [birth year], 55 y.o.   MRN: [id number] History of Present Illness: [name] is a 55 year old white female,single, sp hysterectomy, in for a well woman gyn exam and pap. She is active, works in her yard. Lab Results  Component Value Date   DIAGPAP  05/20/2017    -  NEGATIVE FOR INTRAEPITHELIAL LESIONS OR MALIGNANCY   HPV DETECTED (A) 05/20/2017  PCP is Dr Legrand Rams   Current Medications, Allergies, Past Medical History, Past Surgical History, Family History and Social History were reviewed in [facility] record.     Review of Systems: Patient denies any headaches, hearing loss, fatigue, blurred vision, shortness of breath, chest pain, abdominal pain, problems with bowel movements, urination, or intercourse.(no sex in 10 years) No joint pain or mood swings. +hot flashes She says she bleeds from rectal if strains when cutting wood, she has colonoscopy appt in August she says   Physical Exam:BP 131/85 (BP Location: Left Arm, Patient Position: Sitting, Cuff Size: Normal)   Pulse 90   Ht 5' (1.524 m)   Wt 179 lb (81.2 kg)   BMI 34.96 kg/m  General:  Well developed, well nourished, no acute distress Skin:  Warm and dry Neck:  Midline trachea, normal thyroid, good ROM, no lymphadenopathy Lungs: has bilateral wheezing, it clears some with coughing, she has inhalers but does not use  Breast:  No dominant palpable mass, retraction, or nipple discharge Cardiovascular: Regular rate and rhythm Abdomen:  Soft, non tender, no hepatosplenomegaly Pelvic:  External genitalia is normal in appearance, no lesions.  The vagina is pale with loss of moisture. Urethra has no lesions or masses. The cervix and uterus are absent, pap with HR HPV genotyping performed.  No adnexal masses or tenderness noted.Bladder is non tender, no masses felt. Rectal: Good sphincter tone, no polyps, or hemorrhoids felt.  Hemoccult negative. Extremities/musculoskeletal:   No swelling or varicosities noted, no clubbing or cyanosis Psych:  No mood changes, alert and cooperative,seems happy AA is 0 Fall risk is low PHQ 9 score is 0 GAD 7 score is 0  Upstream - 05/22/20 0914      Pregnancy Intention Screening   Does the patient want to become pregnant in the next year? No    Does the patient's partner want to become pregnant in the next year? No    Would the patient like to discuss contraceptive options today? No      Contraception Wrap Up   Current Method Female Sterilization   hyst   End Method Female Sterilization   hyst   Contraception Counseling Provided No         Examination chaperoned by Estill Bamberg LPN  Impression and Plan: 1. Encounter for gynecological examination with Papanicolaou smear of cervix Pap sent Physical with PCP yearly Labs with PCP Pap and physical in 3 years if desired  2. Encounter for screening fecal occult blood testing   3. Smoker She declines quitting, but try to cut down, she does not smoke in the house or car  4. Screening mammogram for breast cancer Scheduled mammogram for her at [facility] 05/30/20 at 9 am  5. S/P hysterectomy Had cervical cancer years ago

## 2020-05-25 LAB — CYTOLOGY - PAP
Adequacy: ABSENT
Comment: NEGATIVE
Diagnosis: NEGATIVE
High risk HPV: NEGATIVE

## 2020-05-30 ENCOUNTER — Ambulatory Visit (HOSPITAL_COMMUNITY)
Admission: RE | Admit: 2020-05-30 | Discharge: 2020-05-30 | Disposition: A | Discharge status: Home / Self Care | Payer: Medicaid Other | Source: Ambulatory Visit | Attending: Adult Health | Admitting: Adult Health

## 2020-05-30 DIAGNOSIS — Z1231 Encounter for screening mammogram for malignant neoplasm of breast: Secondary | ICD-10-CM | POA: Diagnosis present

## 2020-06-13 ENCOUNTER — Other Ambulatory Visit: Payer: Self-pay

## 2020-06-13 ENCOUNTER — Ambulatory Visit (HOSPITAL_COMMUNITY)
Admission: RE | Admit: 2020-06-13 | Discharge: 2020-06-13 | Disposition: A | Discharge status: Home / Self Care | Payer: Medicaid Other | Source: Ambulatory Visit | Attending: Gerontology | Admitting: Gerontology

## 2020-06-13 ENCOUNTER — Other Ambulatory Visit (HOSPITAL_COMMUNITY): Payer: Self-pay | Admitting: Gerontology

## 2020-06-13 DIAGNOSIS — M25511 Pain in right shoulder: Secondary | ICD-10-CM

## 2020-06-13 DIAGNOSIS — R059 Cough, unspecified: Secondary | ICD-10-CM

## 2020-07-06 ENCOUNTER — Ambulatory Visit: Payer: Medicaid Other | Admitting: Orthopedic Surgery

## 2020-07-17 ENCOUNTER — Other Ambulatory Visit: Payer: Self-pay

## 2020-07-17 ENCOUNTER — Ambulatory Visit: Payer: Medicaid Other | Admitting: Orthopedic Surgery

## 2020-07-17 ENCOUNTER — Encounter: Payer: Self-pay | Admitting: Orthopedic Surgery

## 2020-07-17 VITALS — BP 164/118 | HR 96 | Ht 60.0 in | Wt 178.0 lb

## 2020-07-17 DIAGNOSIS — M25511 Pain in right shoulder: Secondary | ICD-10-CM | POA: Diagnosis not present

## 2020-07-17 NOTE — Progress Notes (Signed)
New Patient Visit  Assessment: [name] is a 55 y.o. female with the following: 1. Arthralgia of right acromioclavicular joint; sustained AC joint separation 8 months ago  Plan: Reviewed radiographs with the patient in clinic today which do not demonstrate an acute injury. On physical exam, she has a prominence over the [facility] joint.  This is tender to palpation.  This is where she is painful, although this is improving.  She does have some irritation of her rotator cuff tendons, but she is able to do over that she needs to do.  She primarily wanted to make sure that there was nothing else going on.  She is not interested in an injection.  We did discuss this as an option, she can return to clinic at any time.  No restrictions on her activities.  Discussed Voltaren gel as needed for her shoulder.  All questions were answered she is amenable this plan.  Follow-up: Return if symptoms worsen or fail to improve.  Subjective:  Chief Complaint  Patient presents with   Shoulder Pain    Rt shoulder pain on and off for 8 mos. After a fall and now she has a knot on shoulder that she's had for 6 mos. Pt states she does cut wood with a chainsaw, and uses a push mower to cut grass. Pt states she can get through daily tasks now but would like to know what's the knot.     History of Present Illness: [name] is a 55 y.o. RHD female who has been referred to clinic today by Rosita Fire, MD for evaluation of right shoulder pain.  She states she has had pain in her right shoulder for the last 6 months.  She notes a fall, approximately 8 months ago, where she landed directly onto the anterior lateral aspect of her right shoulder.  She is also noticed a "knot" over the superior shoulder, which is painful.  He has some pain over the lateral shoulder, which radiates distally.  However, this does not affect her function overall.  She is very active, and high functioning.  She takes Tylenol and  ibuprofen intermittently, primarily due to chronic health conditions.  No physical therapy.  No previous injections.   Review of Systems: No fevers or chills No numbness or tingling No chest pain No shortness of breath No bowel or bladder dysfunction No GI distress No headaches   Medical History:  Past Medical History:  Diagnosis Date   Asthma    Bipolar disease, chronic ([place])    Cancer ([place])    cervical cancer. partial hysterectomy in 20s.    Cervical cancer (HCC)    COPD (chronic obstructive pulmonary disease) (HCC)    DDD (degenerative disc disease)    Diabetes mellitus    not on medication   Factor 5 Leiden mutation, heterozygous (HCC)    Hep C w/o coma, chronic (HCC)    Hypertension    MRSA (methicillin resistant Staphylococcus aureus) carrier    Pneumonia    Seizures (HCC)     Past Surgical History:  Procedure Laterality Date   ABDOMINAL HYSTERECTOMY     cervical cancer   BACK SURGERY     carpel tunnel     COLONOSCOPY  20 yrs   [place]   MASS EXCISION      Family History  Problem Relation Age of Onset   Diabetes Mother    Cancer Mother        breast  Hypertension Mother    Cancer Other    Cancer Paternal Grandfather    Cancer Maternal Grandmother    Diabetes Maternal Grandfather    Other Father        brain tumor, fluid around heart   Factor V Leiden deficiency Father    Drug abuse Sister    Other Brother        MVA   Colon cancer Neg Hx    Liver disease Neg Hx    Social History   Tobacco Use   Smoking status: Every Day    Packs/day: 1.00    Years: 40.00    Pack years: 40.00    Types: Cigarettes   Smokeless tobacco: Never  Vaping Use   Vaping Use: Never used  Substance Use Topics   Alcohol use: No   Drug use: Yes    Types: Marijuana    Comment: twice a week    Allergies  Allergen Reactions   Cephalexin Shortness Of Breath   Celecoxib Nausea And Vomiting   Imipramine Hcl Other (See Comments)    Facial twisting   Other  Nausea And Vomiting    Muscle Relaxants   Abilify [Aripiprazole] Nausea And Vomiting   Augmentin [Amoxicillin-Pot Clavulanate] Nausea And Vomiting   Keflex [Cephalexin]    Penicillins Nausea And Vomiting    Has patient had a PCN reaction causing immediate rash, facial/tongue/throat swelling, SOB or lightheadedness with hypotension: Yes Has patient had a PCN reaction causing severe rash involving mucus membranes or skin necrosis: Yes Has patient had a PCN reaction that required hospitalization No Has patient had a PCN reaction occurring within the last 10 years: No If all of the above answers are "NO", then may proceed with Cephalosporin use.    Seroquel [Quetiapine Fumarate]    Aspirin Swelling and Rash   Sulfa Antibiotics Rash and Other (See Comments)    Triggers headaches also    Current Meds  Medication Sig   Acetaminophen (TYLENOL PO) Take by mouth as needed.   albuterol (PROVENTIL HFA;VENTOLIN HFA) 108 (90 BASE) MCG/ACT inhaler Inhale 2 puffs into the lungs every 6 (six) hours as needed. For shortness of breath   gabapentin (NEURONTIN) 300 MG capsule Take 1,200 mg by mouth 2 (two) times daily.   hydrochlorothiazide (HYDRODIURIL) 25 MG tablet Take 25 mg by mouth daily.   loratadine (CLARITIN) 10 MG tablet Take 10 mg by mouth daily.   omeprazole (PRILOSEC) 20 MG capsule Take 1 capsule (20 mg total) by mouth daily.   Potassium Chloride ER 20 MEQ TBCR Take 20 mEq daily by mouth.     Objective: BP (!) 164/118   Pulse 96   Ht 5' (1.524 m)   Wt 178 lb (80.7 kg)   BMI 34.76 kg/m   Physical Exam:  General: Alert and oriented. and No acute distress. Gait: Normal gait.  Evaluation of right shoulder demonstrates no swelling.  There is a deformity and prominence over the [facility] joint.  Mild tenderness to palpation.  5/5 strength in the right upper extremity.  Full and painless range of motion compared to the contralateral side.  Some pain in the lateral aspect of the shoulder with  external and internal rotation in the 90 degree abduction position.  Range of motion in this position is similar to the contralateral side.  Fingers are warm and well-perfused.  IMAGING: I personally reviewed images previously obtained in clinic  X-rays of the right shoulder were previously obtained in clinic and do not demonstrate  an acute injury.  The [facility] joint is not well visualized.  On available views, she may have small amount of calcific deposits within the rotator cuff tendons.   New Medications:  No orders of the defined types were placed in this encounter.     Mordecai Rasmussen, MD  07/17/2020 1:10 PM

## 2020-08-27 NOTE — Progress Notes (Signed)
Referring Provider:Fanta, Brandon Melnick, MD Primary Care Physician:  Rosita Fire, MD Primary Gastroenterologist:  Dr. Abbey Chatters.    Chief Complaint  Patient presents with   Rectal Bleeding    Staining and lifting see a little blood and due for colonoscopy     HPI:   [name] is a 55 y.o. female presenting today at the request of PCP for rectal bleeding.   Recent FOBT negative May 2022. No colonoscopy on file.  Today:  2 year history of intermittent brbpr when lifting or straining with physical activity. None in the last 2 months since she stopped lifting wood. Blood in underwear. Associated burning. No sharp pain. Bms are "flat". Chronic for 10-15 years. No constipation. Bms every morning, soft. No blood when having Bms.   No abdominal pain, unintentional weight loss, or diarrhea.  Very rare ibuprofen.   GERD is well controlled on omeprazole 20 mg daily managed by PCP. No dysphagia.   Intermittent sharp CP lasting about 1 minute and self resolves. Occurs at rest. No associated SOB, headache, nausea, lightheadedness, or diaphoresis. Started about 3 months ago.   TCS about 20 years ago at [facility]. Not sure about polyps.   Declined rectal exam.   Past Medical History:  Diagnosis Date   Asthma    Bipolar disease, chronic (HCC)    Cancer ([place])    cervical cancer. partial hysterectomy in 20s.    Cervical cancer (HCC)    COPD (chronic obstructive pulmonary disease) (HCC)    DDD (degenerative disc disease)    Diabetes mellitus    not on medication   Factor 5 Leiden mutation, heterozygous ([place])    Hep C w/o coma, chronic ([place])    genotype 1 A without cirrhosis s/p treatment with Harvoni completed Jan 2018, HCV RNA not detected March 2018.   Hypertension    MRSA (methicillin resistant Staphylococcus aureus) carrier    Pneumonia    Seizures (HCC)     Past Surgical History:  Procedure Laterality Date   ABDOMINAL HYSTERECTOMY     cervical cancer   BACK SURGERY      carpel tunnel     COLONOSCOPY  20 yrs   [place]   MASS EXCISION      Current Outpatient Medications  Medication Sig Dispense Refill   Acetaminophen (TYLENOL PO) Take by mouth as needed.     albuterol (PROVENTIL HFA;VENTOLIN HFA) 108 (90 BASE) MCG/ACT inhaler Inhale 2 puffs into the lungs every 6 (six) hours as needed. For shortness of breath     beclomethasone (QVAR) 40 MCG/ACT inhaler Inhale 2 puffs into the lungs as needed.     gabapentin (NEURONTIN) 300 MG capsule Take 1,200 mg by mouth 2 (two) times daily.     hydrochlorothiazide (HYDRODIURIL) 25 MG tablet Take 25 mg by mouth daily.     levalbuterol (XOPENEX) 1.25 MG/3ML nebulizer solution Take 1.25 mg by nebulization every 4 (four) hours as needed for wheezing or shortness of breath.     loratadine (CLARITIN) 10 MG tablet Take 10 mg by mouth daily.     omeprazole (PRILOSEC) 20 MG capsule Take 1 capsule (20 mg total) by mouth daily. 90 capsule 3   Potassium Chloride ER 20 MEQ TBCR Take 20 mEq daily by mouth.      polyethylene glycol-electrolytes (NULYTELY) 420 g solution As directed 4000 mL 0   No current facility-administered medications for this visit.    Allergies as of 08/29/2020 - Review Complete 08/29/2020  Allergen Reaction  Noted   Cephalexin Shortness Of Breath 01/10/2011   Celecoxib Nausea And Vomiting 10/09/2014   Imipramine hcl Other (See Comments) 10/04/2013   Other Nausea And Vomiting 10/09/2014   Abilify [aripiprazole] Nausea And Vomiting 11/04/2014   Augmentin [amoxicillin-pot clavulanate] Nausea And Vomiting 09/25/2012   Keflex [cephalexin]  11/16/2016   Penicillins Nausea And Vomiting 09/25/2012   Seroquel [quetiapine fumarate]  11/04/2014   Aspirin Swelling and Rash 09/25/2012   Sulfa antibiotics Rash and Other (See Comments) 09/25/2012    Family History  Problem Relation Age of Onset   Diabetes Mother    Cancer Mother        breast    Hypertension Mother    Diverticulitis Mother        required  surgery   Other Father        brain tumor, fluid around heart   Factor V Leiden deficiency Father    Drug abuse Sister    Other Brother        MVA   Cancer Maternal Grandmother    Diabetes Maternal Grandfather    Cancer Paternal Grandfather    Cancer Other    Colon cancer Neg Hx    Liver disease Neg Hx     Social History   Socioeconomic History   Marital status: Single    Spouse name: Not on file   Number of children: Not on file   Years of education: Not on file   Highest education level: Not on file  Occupational History   Not on file  Tobacco Use   Smoking status: Every Day    Packs/day: 1.00    Years: 40.00    Pack years: 40.00    Types: Cigarettes   Smokeless tobacco: Never  Vaping Use   Vaping Use: Never used  Substance and Sexual Activity   Alcohol use: No   Drug use: Yes    Types: Marijuana    Comment: twice a week   Sexual activity: Not Currently    Birth control/protection: Surgical    Comment: hyst  Other Topics Concern   Not on file  Social History Narrative   Not on file   Social Determinants of Health   Financial Resource Strain: Low Risk    Difficulty of Paying Living Expenses: Not very hard  Food Insecurity: No Food Insecurity   Worried About Charity fundraiser in the Last Year: Never true   Ran Out of Food in the Last Year: Never true  Transportation Needs: No Transportation Needs   Lack of Transportation (Medical): No   Lack of Transportation (Non-Medical): No  Physical Activity: Sufficiently Active   Days of Exercise per Week: 7 days   Minutes of Exercise per Session: 100 min  Stress: No Stress Concern Present   Feeling of Stress : Not at all  Social Connections: Moderately Isolated   Frequency of Communication with Friends and Family: More than three times a week   Frequency of Social Gatherings with Friends and Family: Twice a week   Attends Religious Services: 1 to 4 times per year   Active Member of [employer] or Organizations: No    Attends Music therapist: Never   Marital Status: Never married  Human resources officer Violence: Not At Risk   Fear of Current or Ex-Partner: No   Emotionally Abused: No   Physically Abused: No   Sexually Abused: No    Review of Systems: Gen: Denies any fever, chills, cold or flulike symptoms, presyncope, syncope.  CV: See HPI Resp: Admits to mild shortness of breath with intense physical activity.  Chronic intermittent cough. GI: see HPI GU : Denies urinary burning, urinary frequency, urinary hesitancy MS: Denies joint pain Derm: Denies rash Psych: Denies depression, anxiety Heme: See HPI  Physical Exam: BP 115/76   Pulse 86   Temp (!) 97 F (36.1 C) (Temporal)   Ht '5\' 1"'$  (1.549 m)   Wt 178 lb 8 oz (81 kg)   BMI 33.73 kg/m  General:   Alert and oriented. Pleasant and cooperative. Well-nourished and well-developed.  Head:  Normocephalic and atraumatic. Eyes:  Without icterus, sclera clear and conjunctiva pink.  Ears:  Normal auditory acuity. Lungs:  Scattered wheezes.  No rales, or rhonchi. No distress.  Heart:  S1, S2 present without murmurs appreciated.  Abdomen:  +BS, soft, non-tender and non-distended. No HSM noted. No guarding or rebound. No masses appreciated.  Rectal:  Patient declined.  Msk:  Symmetrical without gross deformities. Normal posture. Extremities:  Without edema. Neurologic:  Alert and  oriented x4;  grossly normal neurologically. Skin:  Intact without significant lesions or rashes. Psych: Normal mood and affect.    Assessment: 55 year old female with history of mildly elevated LFTs in the setting of hepatitis C s/p treatment with Harvoni in 2018, GERD, COPD, HTN, bipolar disorder, presenting today at the request of PCP due to rectal bleeding.  Rectal bleeding: 2-year history of intermittent low-volume bright red blood per rectum if straining or lifting with physical activity, no bright red blood with bowel movements. Associated rectal  burning. Last occurred 2 months ago.  Denies constipation. Reports BMs are chronically "flat". Denies unintentional weight loss or family history of colon cancer. She reports having a colonoscopy about 20 years ago at [facility] without polyps. Declined rectal exam today.   Query hemorrhoidal bleeding previously. Can't rule out other etiology such as polyps or malignancy. Recommend colonoscopy for further evaluation.   History of Hep C: Genotype 1a, associated LFTs s/p treatment with Harvoni (completed January 2018), HCVRNA not detected March 2018, LFTs normalized. Will repeat HCV RNA one more time to ensure SVR.    Plan: Proceed with colonoscopy with propofol with Dr. Abbey Chatters in the near future. The risks, benefits, and alternatives have been discussed with the patient in detail. The patient states understanding and desires to proceed.   ASA III May use Preparation H twice daily for 7-10 days if rectal bleeding returns.  Recheck HCV RNA. Follow-up per Dr. Penelope Coop, PA-C [facility] 08/29/2020

## 2020-08-29 ENCOUNTER — Other Ambulatory Visit: Payer: Self-pay

## 2020-08-29 ENCOUNTER — Encounter: Payer: Self-pay | Admitting: *Deleted

## 2020-08-29 ENCOUNTER — Encounter: Payer: Self-pay | Admitting: Gastroenterology

## 2020-08-29 ENCOUNTER — Ambulatory Visit: Payer: Medicaid Other | Admitting: Gastroenterology

## 2020-08-29 VITALS — BP 115/76 | HR 86 | Temp 97.0°F | Ht 61.0 in | Wt 178.5 lb

## 2020-08-29 DIAGNOSIS — K625 Hemorrhage of anus and rectum: Secondary | ICD-10-CM

## 2020-08-29 DIAGNOSIS — Z8619 Personal history of other infectious and parasitic diseases: Secondary | ICD-10-CM | POA: Diagnosis not present

## 2020-08-29 MED ORDER — PEG 3350-KCL-NA BICARB-NACL 420 G PO SOLR: ORAL | 0 refills | Status: DC

## 2020-08-29 NOTE — Patient Instructions (Signed)
Please have blood work completed at [facility].  We will arrange for you to have a colonoscopy in the near future with Dr. Abbey Chatters.  If you have return of rectal bleeding, you may use Preparation H twice daily for 7 to 10 days at a time.  We will plan to see back in the office as Dr. Abbey Chatters recommends.  Do not hesitate to call if you have any questions or concerns.  It was nice meeting you today!  Aliene Altes, PA-C [facility]

## 2020-08-30 ENCOUNTER — Other Ambulatory Visit (HOSPITAL_COMMUNITY)
Admission: RE | Admit: 2020-08-30 | Discharge: 2020-08-30 | Disposition: A | Discharge status: Home / Self Care | Payer: Medicaid Other | Source: Ambulatory Visit | Attending: Gastroenterology | Admitting: Gastroenterology

## 2020-08-30 DIAGNOSIS — Z8619 Personal history of other infectious and parasitic diseases: Secondary | ICD-10-CM | POA: Diagnosis present

## 2020-08-31 LAB — HCV RNA QUANT: HCV Quantitative: NOT DETECTED IU/mL (ref >50)

## 2020-09-18 NOTE — Patient Instructions (Signed)
[name]  09/18/2020     '@PREFPERIOPPHARMACY'$ @   Your procedure is scheduled on  09/24/2020.   Report to [facility] at  0700 A.M.   Call this number if you have problems the morning of surgery:  [phone]   Remember:  Follow the diet and prep instructions given to you by the office.     Use your nebulizer and your inhaler before you come and bring your rescue inhaler with you.    Take these medicines the morning of surgery with A SIP OF WATER                    nexium, gabapentin.     Do not wear jewelry, make-up or nail polish.  Do not wear lotions, powders, or perfumes, or deodorant.  Do not shave 48 hours prior to surgery.  Men may shave face and neck.  Do not bring valuables to the hospital.  [facility] is not responsible for any belongings or valuables.  Contacts, dentures or bridgework may not be worn into surgery.  Leave your suitcase in the car.  After surgery it may be brought to your room.  For patients admitted to the hospital, discharge time will be determined by your treatment team.  Patients discharged the day of surgery will not be allowed to drive home and must have someone with them for 24 hours.    Special instructions:   DO NOT smoke tobacco or vape for 24 hours before your procedure.  Please read over the following fact sheets that you were given. Anesthesia Post-op Instructions and Care and Recovery After Surgery      Colonoscopy, Adult, Care After This sheet gives you information about how to care for yourself after your procedure. Your health care provider may also give you more specific instructions. If you have problems or questions, contact your health care provider. What can I expect after the procedure? After the procedure, it is common to have: A small amount of blood in your stool for 24 hours after the procedure. Some gas. Mild cramping or bloating of your abdomen. Follow these instructions at home: Eating and  drinking  Drink enough fluid to keep your urine pale yellow. Follow instructions from your health care provider about eating or drinking restrictions. Resume your normal diet as instructed by your health care provider. Avoid heavy or fried foods that are hard to digest. Activity Rest as told by your health care provider. Avoid sitting for a long time without moving. Get up to take short walks every 1-2 hours. This is important to improve blood flow and breathing. Ask for help if you feel weak or unsteady. Return to your normal activities as told by your health care provider. Ask your health care provider what activities are safe for you. Managing cramping and bloating  Try walking around when you have cramps or feel bloated. Apply heat to your abdomen as told by your health care provider. Use the heat source that your health care provider recommends, such as a moist heat pack or a heating pad. Place a towel between your skin and the heat source. Leave the heat on for 20-30 minutes. Remove the heat if your skin turns bright red. This is especially important if you are unable to feel pain, heat, or cold. You may have a greater risk of getting burned. General instructions If you were given a sedative during the procedure, it can affect you for  several hours. Do not drive or operate machinery until your health care provider says that it is safe. For the first 24 hours after the procedure: Do not sign important documents. Do not drink alcohol. Do your regular daily activities at a slower pace than normal. Eat soft foods that are easy to digest. Take over-the-counter and prescription medicines only as told by your health care provider. Keep all follow-up visits as told by your health care provider. This is important. Contact a health care provider if: You have blood in your stool 2-3 days after the procedure. Get help right away if you have: More than a small spotting of blood in your  stool. Large blood clots in your stool. Swelling of your abdomen. Nausea or vomiting. A fever. Increasing pain in your abdomen that is not relieved with medicine. Summary After the procedure, it is common to have a small amount of blood in your stool. You may also have mild cramping and bloating of your abdomen. If you were given a sedative during the procedure, it can affect you for several hours. Do not drive or operate machinery until your health care provider says that it is safe. Get help right away if you have a lot of blood in your stool, nausea or vomiting, a fever, or increased pain in your abdomen. This information is not intended to replace advice given to you by your health care provider. Make sure you discuss any questions you have with your health care provider. Document Revised: 12/17/2018 Document Reviewed: 07/19/2018 Elsevier Patient Education  [place] After This sheet gives you information about how to care for yourself after your procedure. Your health care provider may also give you more specific instructions. If you have problems or questions, contact your health care provider. What can I expect after the procedure? After the procedure, it is common to have: Tiredness. Forgetfulness about what happened after the procedure. Impaired judgment for important decisions. Nausea or vomiting. Some difficulty with balance. Follow these instructions at home: For the time period you were told by your health care provider:   Rest as needed. Do not participate in activities where you could fall or become injured. Do not drive or use machinery. Do not drink alcohol. Do not take sleeping pills or medicines that cause drowsiness. Do not make important decisions or sign legal documents. Do not take care of children on your own. Eating and drinking Follow the diet that is recommended by your health care provider. Drink enough fluid to  keep your urine pale yellow. If you vomit: Drink water, juice, or soup when you can drink without vomiting. Make sure you have little or no nausea before eating solid foods. General instructions Have a responsible adult stay with you for the time you are told. It is important to have someone help care for you until you are awake and alert. Take over-the-counter and prescription medicines only as told by your health care provider. If you have sleep apnea, surgery and certain medicines can increase your risk for breathing problems. Follow instructions from your health care provider about wearing your sleep device: Anytime you are sleeping, including during daytime naps. While taking prescription pain medicines, sleeping medicines, or medicines that make you drowsy. Avoid smoking. Keep all follow-up visits as told by your health care provider. This is important. Contact a health care provider if: You keep feeling nauseous or you keep vomiting. You feel light-headed. You are still sleepy or having trouble  with balance after 24 hours. You develop a rash. You have a fever. You have redness or swelling around the IV site. Get help right away if: You have trouble breathing. You have new-onset confusion at home. Summary For several hours after your procedure, you may feel tired. You may also be forgetful and have poor judgment. Have a responsible adult stay with you for the time you are told. It is important to have someone help care for you until you are awake and alert. Rest as told. Do not drive or operate machinery. Do not drink alcohol or take sleeping pills. Get help right away if you have trouble breathing, or if you suddenly become confused. This information is not intended to replace advice given to you by your health care provider. Make sure you discuss any questions you have with your health care provider. Document Revised: 09/08/2019 Document Reviewed: 11/25/2018 Elsevier Patient  Education  2022 [employer].

## 2020-09-20 ENCOUNTER — Encounter: Payer: Self-pay | Admitting: *Deleted

## 2020-09-20 ENCOUNTER — Encounter (HOSPITAL_COMMUNITY): Payer: Self-pay

## 2020-09-20 ENCOUNTER — Encounter (HOSPITAL_COMMUNITY)
Admission: RE | Admit: 2020-09-20 | Discharge: 2020-09-20 | Disposition: A | Discharge status: Home / Self Care | Payer: Medicaid Other | Source: Ambulatory Visit | Attending: Internal Medicine | Admitting: Internal Medicine

## 2020-09-20 ENCOUNTER — Telehealth: Payer: Self-pay | Admitting: Internal Medicine

## 2020-09-20 NOTE — Telephone Encounter (Signed)
Pt is needing to cancel/reschedule colonoscopy due to her grandchild going to the ER.

## 2020-09-20 NOTE — Telephone Encounter (Signed)
Called pt. She is rescheduling her procedure scheduled for Monday 8/19 with Dr. Abbey Chatters due to her granchild being in the ER today.  She has been placed for 10/17 now at 8:00am. Aware will mail prep instructions with pre-op appt. Confirmed address. Message sent to endo.  FYI to [employer]

## 2020-10-16 NOTE — Patient Instructions (Signed)
[name]  10/16/2020     @PREFPERIOPPHARMACY @   Your procedure is scheduled on 10/22/2020.    Report to [facility] at  [phone]  A.M.   Call this number if you have problems the morning of surgery:  [phone]   Remember:  Follow the diet and prep instructions given to you by the office.     DO NOT take any medications for diabetes the morning of your procedure.    Use your nebulizer and your inhaler before you come and bring your rescue inhaler with you.    Take these medicines the morning of surgery with A SIP OF WATER                 nexium, gabapentin.    Do not wear jewelry, make-up or nail polish.  Do not wear lotions, powders, or perfumes, or deodorant.  Do not shave 48 hours prior to surgery.  Men may shave face and neck.  Do not bring valuables to the hospital.  [facility] is not responsible for any belongings or valuables.  Contacts, dentures or bridgework may not be worn into surgery.  Leave your suitcase in the car.  After surgery it may be brought to your room.  For patients admitted to the hospital, discharge time will be determined by your treatment team.  Patients discharged the day of surgery will not be allowed to drive home and must have someone with them for 24 hours.    Special instructions:   DO NOT smoke tobacco or vape for 24 hours before your procedure.  Please read over the following fact sheets that you were given. Anesthesia Post-op Instructions and Care and Recovery After Surgery      Colonoscopy, Adult, Care After This sheet gives you information about how to care for yourself after your procedure. Your health care provider may also give you more specific instructions. If you have problems or questions, contact your health care provider. What can I expect after the procedure? After the procedure, it is common to have: A small amount of blood in your stool for 24 hours after the procedure. Some gas. Mild cramping or  bloating of your abdomen. Follow these instructions at home: Eating and drinking  Drink enough fluid to keep your urine pale yellow. Follow instructions from your health care provider about eating or drinking restrictions. Resume your normal diet as instructed by your health care provider. Avoid heavy or fried foods that are hard to digest. Activity Rest as told by your health care provider. Avoid sitting for a long time without moving. Get up to take short walks every 1-2 hours. This is important to improve blood flow and breathing. Ask for help if you feel weak or unsteady. Return to your normal activities as told by your health care provider. Ask your health care provider what activities are safe for you. Managing cramping and bloating  Try walking around when you have cramps or feel bloated. Apply heat to your abdomen as told by your health care provider. Use the heat source that your health care provider recommends, such as a moist heat pack or a heating pad. Place a towel between your skin and the heat source. Leave the heat on for 20-30 minutes. Remove the heat if your skin turns bright red. This is especially important if you are unable to feel pain, heat, or cold. You may have a greater risk of getting burned. General instructions If you  were given a sedative during the procedure, it can affect you for several hours. Do not drive or operate machinery until your health care provider says that it is safe. For the first 24 hours after the procedure: Do not sign important documents. Do not drink alcohol. Do your regular daily activities at a slower pace than normal. Eat soft foods that are easy to digest. Take over-the-counter and prescription medicines only as told by your health care provider. Keep all follow-up visits as told by your health care provider. This is important. Contact a health care provider if: You have blood in your stool 2-3 days after the procedure. Get help  right away if you have: More than a small spotting of blood in your stool. Large blood clots in your stool. Swelling of your abdomen. Nausea or vomiting. A fever. Increasing pain in your abdomen that is not relieved with medicine. Summary After the procedure, it is common to have a small amount of blood in your stool. You may also have mild cramping and bloating of your abdomen. If you were given a sedative during the procedure, it can affect you for several hours. Do not drive or operate machinery until your health care provider says that it is safe. Get help right away if you have a lot of blood in your stool, nausea or vomiting, a fever, or increased pain in your abdomen. This information is not intended to replace advice given to you by your health care provider. Make sure you discuss any questions you have with your health care provider. Document Revised: 12/17/2018 Document Reviewed: 07/19/2018 Elsevier Patient Education  [place] After This sheet gives you information about how to care for yourself after your procedure. Your health care provider may also give you more specific instructions. If you have problems or questions, contact your health care provider. What can I expect after the procedure? After the procedure, it is common to have: Tiredness. Forgetfulness about what happened after the procedure. Impaired judgment for important decisions. Nausea or vomiting. Some difficulty with balance. Follow these instructions at home: For the time period you were told by your health care provider:   Rest as needed. Do not participate in activities where you could fall or become injured. Do not drive or use machinery. Do not drink alcohol. Do not take sleeping pills or medicines that cause drowsiness. Do not make important decisions or sign legal documents. Do not take care of children on your own. Eating and drinking Follow the diet that  is recommended by your health care provider. Drink enough fluid to keep your urine pale yellow. If you vomit: Drink water, juice, or soup when you can drink without vomiting. Make sure you have little or no nausea before eating solid foods. General instructions Have a responsible adult stay with you for the time you are told. It is important to have someone help care for you until you are awake and alert. Take over-the-counter and prescription medicines only as told by your health care provider. If you have sleep apnea, surgery and certain medicines can increase your risk for breathing problems. Follow instructions from your health care provider about wearing your sleep device: Anytime you are sleeping, including during daytime naps. While taking prescription pain medicines, sleeping medicines, or medicines that make you drowsy. Avoid smoking. Keep all follow-up visits as told by your health care provider. This is important. Contact a health care provider if: You keep feeling nauseous or you  keep vomiting. You feel light-headed. You are still sleepy or having trouble with balance after 24 hours. You develop a rash. You have a fever. You have redness or swelling around the IV site. Get help right away if: You have trouble breathing. You have new-onset confusion at home. Summary For several hours after your procedure, you may feel tired. You may also be forgetful and have poor judgment. Have a responsible adult stay with you for the time you are told. It is important to have someone help care for you until you are awake and alert. Rest as told. Do not drive or operate machinery. Do not drink alcohol or take sleeping pills. Get help right away if you have trouble breathing, or if you suddenly become confused. This information is not intended to replace advice given to you by your health care provider. Make sure you discuss any questions you have with your health care provider. Document  Revised: 09/08/2019 Document Reviewed: 11/25/2018 Elsevier Patient Education  2022 [employer].

## 2020-10-18 ENCOUNTER — Encounter (HOSPITAL_COMMUNITY)
Admission: RE | Admit: 2020-10-18 | Discharge: 2020-10-18 | Disposition: A | Discharge status: Home / Self Care | Payer: Medicaid Other | Source: Ambulatory Visit | Attending: Internal Medicine | Admitting: Internal Medicine

## 2020-10-18 ENCOUNTER — Encounter (HOSPITAL_COMMUNITY): Payer: Self-pay

## 2020-10-18 ENCOUNTER — Other Ambulatory Visit: Payer: Self-pay

## 2020-10-18 DIAGNOSIS — Z01818 Encounter for other preprocedural examination: Secondary | ICD-10-CM | POA: Diagnosis not present

## 2020-10-18 HISTORY — DX: Gastro-esophageal reflux disease without esophagitis: K21.9

## 2020-10-18 LAB — CBC WITH DIFFERENTIAL/PLATELET
Abs Immature Granulocytes: 0.04 10*3/uL (ref 0.00–0.07)
Basophils Absolute: 0 10*3/uL (ref 0.0–0.1)
Basophils Relative: 1 %
Eosinophils Absolute: 0.2 10*3/uL (ref 0.0–0.5)
Eosinophils Relative: 2 %
HCT: 42.3 % (ref 36.0–46.0)
Hemoglobin: 14 g/dL (ref 12.0–15.0)
Immature Granulocytes: 1 %
Lymphocytes Relative: 29 %
Lymphs Abs: 2.2 10*3/uL (ref 0.7–4.0)
MCH: 31 pg (ref 26.0–34.0)
MCHC: 33.1 g/dL (ref 30.0–36.0)
MCV: 93.8 fL (ref 80.0–100.0)
Monocytes Absolute: 0.7 10*3/uL (ref 0.1–1.0)
Monocytes Relative: 9 %
Neutro Abs: 4.6 10*3/uL (ref 1.7–7.7)
Neutrophils Relative %: 58 %
Platelets: 300 10*3/uL (ref 150–400)
RBC: 4.51 MIL/uL (ref 3.87–5.11)
RDW: 13.2 % (ref 11.5–15.5)
WBC: 7.8 10*3/uL (ref 4.0–10.5)
nRBC: 0 % (ref 0.0–0.2)

## 2020-10-18 LAB — COMPREHENSIVE METABOLIC PANEL
ALT: 16 U/L (ref 0–44)
AST: 18 U/L (ref 15–41)
Albumin: 4.2 g/dL (ref 3.5–5.0)
Alkaline Phosphatase: 77 U/L (ref 38–126)
Anion gap: 9 (ref 5–15)
BUN: 14 mg/dL (ref 6–20)
CO2: 24 mmol/L (ref 22–32)
Calcium: 9 mg/dL (ref 8.9–10.3)
Chloride: 103 mmol/L (ref 98–111)
Creatinine, Ser: 0.75 mg/dL (ref 0.44–1.00)
GFR, Estimated: >60 mL/min (ref >60)
Glucose, Bld: 148 mg/dL — ABNORMAL HIGH (ref 70–99)
Potassium: 4 mmol/L (ref 3.5–5.1)
Sodium: 136 mmol/L (ref 135–145)
Total Bilirubin: 0.8 mg/dL (ref 0.3–1.2)
Total Protein: 7.5 g/dL (ref 6.5–8.1)

## 2020-10-18 LAB — RAPID URINE DRUG SCREEN, HOSP PERFORMED
Amphetamines: NOT DETECTED
Barbiturates: NOT DETECTED
Benzodiazepines: NOT DETECTED
Cocaine: NOT DETECTED
Opiates: NOT DETECTED
Tetrahydrocannabinol: POSITIVE — AB

## 2020-10-18 LAB — PROTIME-INR
INR: 0.9 (ref 0.8–1.2)
Prothrombin Time: 12.2 seconds (ref 11.4–15.2)

## 2020-10-22 ENCOUNTER — Ambulatory Visit (HOSPITAL_COMMUNITY)
Admission: RE | Admit: 2020-10-22 | Discharge: 2020-10-22 | Disposition: A | Discharge status: Home / Self Care | Payer: Medicaid Other | Source: Ambulatory Visit | Attending: Internal Medicine | Admitting: Internal Medicine

## 2020-10-22 ENCOUNTER — Other Ambulatory Visit: Payer: Self-pay

## 2020-10-22 ENCOUNTER — Encounter (HOSPITAL_COMMUNITY)
Admission: RE | Disposition: A | Discharge status: Home / Self Care | Payer: Self-pay | Source: Ambulatory Visit | Attending: Internal Medicine

## 2020-10-22 ENCOUNTER — Encounter (HOSPITAL_COMMUNITY): Payer: Self-pay

## 2020-10-22 ENCOUNTER — Ambulatory Visit (HOSPITAL_COMMUNITY): Payer: Medicaid Other | Admitting: Anesthesiology

## 2020-10-22 DIAGNOSIS — Z7951 Long term (current) use of inhaled steroids: Secondary | ICD-10-CM | POA: Diagnosis not present

## 2020-10-22 DIAGNOSIS — Z8379 Family history of other diseases of the digestive system: Secondary | ICD-10-CM | POA: Diagnosis not present

## 2020-10-22 DIAGNOSIS — Z881 Allergy status to other antibiotic agents status: Secondary | ICD-10-CM | POA: Diagnosis not present

## 2020-10-22 DIAGNOSIS — K648 Other hemorrhoids: Secondary | ICD-10-CM | POA: Insufficient documentation

## 2020-10-22 DIAGNOSIS — D122 Benign neoplasm of ascending colon: Secondary | ICD-10-CM | POA: Insufficient documentation

## 2020-10-22 DIAGNOSIS — K635 Polyp of colon: Secondary | ICD-10-CM | POA: Diagnosis not present

## 2020-10-22 DIAGNOSIS — F17210 Nicotine dependence, cigarettes, uncomplicated: Secondary | ICD-10-CM | POA: Diagnosis not present

## 2020-10-22 DIAGNOSIS — Z880 Allergy status to penicillin: Secondary | ICD-10-CM | POA: Diagnosis not present

## 2020-10-22 DIAGNOSIS — K625 Hemorrhage of anus and rectum: Secondary | ICD-10-CM | POA: Diagnosis not present

## 2020-10-22 DIAGNOSIS — Z886 Allergy status to analgesic agent status: Secondary | ICD-10-CM | POA: Diagnosis not present

## 2020-10-22 DIAGNOSIS — Z882 Allergy status to sulfonamides status: Secondary | ICD-10-CM | POA: Insufficient documentation

## 2020-10-22 DIAGNOSIS — Z79899 Other long term (current) drug therapy: Secondary | ICD-10-CM | POA: Diagnosis not present

## 2020-10-22 DIAGNOSIS — Z9109 Other allergy status, other than to drugs and biological substances: Secondary | ICD-10-CM | POA: Insufficient documentation

## 2020-10-22 DIAGNOSIS — D124 Benign neoplasm of descending colon: Secondary | ICD-10-CM | POA: Insufficient documentation

## 2020-10-22 DIAGNOSIS — Z888 Allergy status to other drugs, medicaments and biological substances status: Secondary | ICD-10-CM | POA: Insufficient documentation

## 2020-10-22 HISTORY — PX: COLONOSCOPY WITH PROPOFOL: SHX5780

## 2020-10-22 HISTORY — PX: POLYPECTOMY: SHX5525

## 2020-10-22 LAB — GLUCOSE, CAPILLARY: Glucose-Capillary: 154 mg/dL — ABNORMAL HIGH (ref 70–99)

## 2020-10-22 SURGERY — COLONOSCOPY WITH PROPOFOL
Anesthesia: General

## 2020-10-22 MED ORDER — PHENYLEPHRINE 40 MCG/ML (10ML) SYRINGE FOR IV PUSH (FOR BLOOD PRESSURE SUPPORT)
PREFILLED_SYRINGE | INTRAVENOUS | Status: AC
  Filled 2020-10-22: qty 10

## 2020-10-22 MED ORDER — PROPOFOL 10 MG/ML IV BOLUS
INTRAVENOUS | Status: DC | PRN
  Administered 2020-10-22: 50 mg via INTRAVENOUS
  Administered 2020-10-22: 30 mg via INTRAVENOUS
  Administered 2020-10-22: 130 mg via INTRAVENOUS

## 2020-10-22 MED ORDER — PHENYLEPHRINE HCL (PRESSORS) 10 MG/ML IV SOLN
INTRAVENOUS | Status: DC | PRN
  Administered 2020-10-22: 80 ug via INTRAVENOUS

## 2020-10-22 MED ORDER — LACTATED RINGERS IV SOLN: INTRAVENOUS | Status: DC

## 2020-10-22 MED ORDER — LIDOCAINE HCL (CARDIAC) PF 50 MG/5ML IV SOSY
PREFILLED_SYRINGE | INTRAVENOUS | Status: DC | PRN
  Administered 2020-10-22: 50 mg via INTRAVENOUS

## 2020-10-22 NOTE — Anesthesia Procedure Notes (Signed)
Date/Time: 10/22/2020 8:10 AM Performed by: Vista Deck, CRNA Pre-anesthesia Checklist: Patient identified, Emergency Drugs available, Suction available, Timeout performed and Patient being monitored Patient Re-evaluated:Patient Re-evaluated prior to induction Oxygen Delivery Method: Nasal Cannula

## 2020-10-22 NOTE — Transfer of Care (Signed)
Immediate Anesthesia Transfer of Care Note  Patient: [name]  Procedure(s) Performed: COLONOSCOPY WITH PROPOFOL POLYPECTOMY  Patient Location: PACU  Anesthesia Type:General  Level of Consciousness: awake, alert  and oriented  Airway & Oxygen Therapy: Patient Spontanous Breathing  Post-op Assessment: Report given to RN, Post -op Vital signs reviewed and stable and Patient moving all extremities X 4  Post vital signs: Reviewed and stable  Last Vitals:  Vitals Value Taken Time  BP    Temp    Pulse    Resp    SpO2      Last Pain:  Vitals:   10/22/20 0810  TempSrc:   PainSc: 0-No pain         Complications: No notable events documented.

## 2020-10-22 NOTE — Op Note (Signed)
[facility] Patient Name: [name] Procedure Date: 10/22/2020 7:55 AM MRN: [id number] Date of Birth: 1965-02-03 Attending MD: Molly Alas. Molly Chatters DO CSN: [id number] Age: 55 Admit Type: Outpatient Procedure:                Colonoscopy Indications:              Rectal bleeding Providers:                Molly Alas. Molly Chatters, DO, Molly Riggers, RN, [name] Referring MD:              Medicines:                See the Anesthesia note for documentation of the                            administered medications Complications:            No immediate complications. Estimated Blood Loss:     Estimated blood loss was minimal. Procedure:                Pre-Anesthesia Assessment:                           - The anesthesia plan was to use monitored                            anesthesia care (MAC).                           After obtaining informed consent, the colonoscope                            was passed under direct vision. Throughout the                            procedure, the patient's blood pressure, pulse, and                            oxygen saturations were monitored continuously. The                            [id number]-HQ190L ([id number]) was introduced through the                            anus and advanced to the the cecum, identified by                            appendiceal orifice and ileocecal valve. The                            colonoscopy was performed without difficulty. The                            patient tolerated the procedure well. The quality                            of the bowel preparation  was evaluated using the                            BBPS [facility] Bowel Preparation Scale) with scores                            of: Right Colon = 3, Transverse Colon = 3 and Left                            Colon = 3 (entire mucosa seen well with no residual                            staining, small fragments of stool or opaque                            liquid). The  total BBPS score equals 9. Scope In: 8:14:49 AM Scope Out: 8:31:07 AM Scope Withdrawal Time: 0 hours 14 minutes 0 seconds  Total Procedure Duration: 0 hours 16 minutes 18 seconds  Findings:      The perianal and digital rectal examinations were normal.      Non-bleeding internal hemorrhoids were found during retroflexion.      Three sessile polyps were found in the ascending colon. The polyps were       3 to 5 mm in size. These polyps were removed with a cold snare.       Resection and retrieval were complete.      Four sessile polyps were found in the descending colon. The polyps were       3 to 6 mm in size. These polyps were removed with a cold snare.       Resection and retrieval were complete.      The exam was otherwise without abnormality. Impression:               - Non-bleeding internal hemorrhoids.                           - Three 3 to 5 mm polyps in the ascending colon,                            removed with a cold snare. Resected and retrieved.                           - Four 3 to 6 mm polyps in the descending colon,                            removed with a cold snare. Resected and retrieved.                           - The examination was otherwise normal. Moderate Sedation:      Per Anesthesia Care Recommendation:           - Patient has a contact number available for                            emergencies. The signs and symptoms of  potential                            delayed complications were discussed with the                            patient. Return to normal activities tomorrow.                            Written discharge instructions were provided to the                            patient.                           - Resume previous diet.                           - Continue present medications.                           - Await pathology results.                           - Repeat colonoscopy in 5 years for surveillance.                           - Return  to GI clinic PRN with [name] for                            hemorrhoid banding if bleeding continues. Procedure Code(s):        --- Professional ---                           [phone], Colonoscopy, flexible; with removal of                            tumor(s), polyp(s), or other lesion(s) by snare                            technique Diagnosis Code(s):        --- Professional ---                           K63.5, Polyp of colon                           K64.8, Other hemorrhoids                           K62.5, Hemorrhage of anus and rectum CPT copyright 2019 American Medical Association. All rights reserved. The codes documented in this report are preliminary and upon coder review may  be revised to meet current compliance requirements. Molly Alas. Molly Chatters, DO [place] Molly Chatters, DO 10/22/2020 8:35:45 AM This report has been signed electronically. Number of Addenda: 0

## 2020-10-22 NOTE — Anesthesia Postprocedure Evaluation (Signed)
Anesthesia Post Note  Patient: [name]  Procedure(s) Performed: COLONOSCOPY WITH PROPOFOL POLYPECTOMY  Patient location during evaluation: PACU Anesthesia Type: General Level of consciousness: awake and alert and oriented Pain management: pain level controlled Vital Signs Assessment: post-procedure vital signs reviewed and stable Respiratory status: spontaneous breathing, nonlabored ventilation and respiratory function stable Cardiovascular status: blood pressure returned to baseline and stable Postop Assessment: no apparent nausea or vomiting Anesthetic complications: no   No notable events documented.   Last Vitals:  Vitals:   10/22/20 0845 10/22/20 0852  BP: 107/73 (!) 144/88  Pulse: 80   Resp: 19 18  Temp:  36.4 C  SpO2: 97% 100%    Last Pain:  Vitals:   10/22/20 0852  TempSrc: Oral  PainSc: 0-No pain                 [name] [name]

## 2020-10-22 NOTE — Discharge Instructions (Addendum)
  Colonoscopy Discharge Instructions  Read the instructions outlined below and refer to this sheet in the next few weeks. These discharge instructions provide you with general information on caring for yourself after you leave the hospital. Your doctor may also give you specific instructions. While your treatment has been planned according to the most current medical practices available, unavoidable complications occasionally occur.   ACTIVITY You may resume your regular activity, but move at a slower pace for the next 24 hours.  Take frequent rest periods for the next 24 hours.  Walking will help get rid of the air and reduce the bloated feeling in your belly (abdomen).  No driving for 24 hours (because of the medicine (anesthesia) used during the test).   Do not sign any important legal documents or operate any machinery for 24 hours (because of the anesthesia used during the test).  NUTRITION Drink plenty of fluids.  You may resume your normal diet as instructed by your doctor.  Begin with a light meal and progress to your normal diet. Heavy or fried foods are harder to digest and may make you feel sick to your stomach (nauseated).  Avoid alcoholic beverages for 24 hours or as instructed.  MEDICATIONS You may resume your normal medications unless your doctor tells you otherwise.  WHAT YOU CAN EXPECT TODAY Some feelings of bloating in the abdomen.  Passage of more gas than usual.  Spotting of blood in your stool or on the toilet paper.  IF YOU HAD POLYPS REMOVED DURING THE COLONOSCOPY: No aspirin products for 7 days or as instructed.  No alcohol for 7 days or as instructed.  Eat a soft diet for the next 24 hours.  FINDING OUT THE RESULTS OF YOUR TEST Not all test results are available during your visit. If your test results are not back during the visit, make an appointment with your caregiver to find out the results. Do not assume everything is normal if you have not heard from your  caregiver or the medical facility. It is important for you to follow up on all of your test results.  SEEK IMMEDIATE MEDICAL ATTENTION IF: You have more than a spotting of blood in your stool.  Your belly is swollen (abdominal distention).  You are nauseated or vomiting.  You have a temperature over 101.  You have abdominal pain or discomfort that is severe or gets worse throughout the day.   Your colonoscopy revealed 7 polyp(s) which I removed successfully. Await pathology results, my office will contact you. I recommend repeating colonoscopy in 3-5 years for surveillance purposes depending on pathology results. You do have internal hemorrhoids which is likely the cause of your rectal bleeding. If this continues to be a problem, we can set you up for banding in our clinic with Roseanne Kaufman. Otherwise follow up as needed.    I hope you have a great rest of your week!  Elon Alas. Abbey Chatters, D.O. Gastroenterology and Hepatology [facility]

## 2020-10-22 NOTE — H&P (Signed)
Primary Care Physician:  Rosita Fire, MD Primary Gastroenterologist:  Dr. Abbey Chatters  Pre-Procedure History & Physical: HPI:  [name] is a 55 y.o. female is here for a colonoscopy to be performed for rectal bleeding  Past Medical History:  Diagnosis Date   Asthma    Bipolar disease, chronic ([place])    Cancer ([place])    cervical cancer. partial hysterectomy in 20s.    Cervical cancer (HCC)    COPD (chronic obstructive pulmonary disease) (HCC)    DDD (degenerative disc disease)    Diabetes mellitus    not on medication   Factor 5 Leiden mutation, heterozygous ([place])    GERD (gastroesophageal reflux disease)    Hep C w/o coma, chronic (HCC)    genotype 1 A without cirrhosis s/p treatment with Harvoni completed Jan 2018, HCV RNA not detected March 2018.   Hypertension    MRSA (methicillin resistant Staphylococcus aureus) carrier    Pneumonia    Seizures (HCC)     Past Surgical History:  Procedure Laterality Date   ABDOMINAL HYSTERECTOMY     cervical cancer   BACK SURGERY     carpel tunnel     COLONOSCOPY  20 yrs   [place] [facility]   MASS EXCISION      Prior to Admission medications   Medication Sig Start Date End Date Taking? Authorizing Provider  acetaminophen (TYLENOL) 325 MG tablet Take 650 mg by mouth every 6 (six) hours as needed for moderate pain.   Yes [provider]  albuterol (PROVENTIL HFA;VENTOLIN HFA) 108 (90 BASE) MCG/ACT inhaler Inhale 2 puffs into the lungs every 6 (six) hours as needed. For shortness of breath   Yes [provider]  albuterol (PROVENTIL) (2.5 MG/3ML) 0.083% nebulizer solution Take 2.5 mg by nebulization 2 (two) times daily as needed for wheezing or shortness of breath.   Yes [provider]  esomeprazole (NEXIUM) 20 MG capsule Take 20 mg by mouth daily. 09/12/20  Yes [provider]  Fluticasone-Umeclidin-Vilant (TRELEGY ELLIPTA) 100-62.5-25 MCG/INH AEPB Inhale 1 puff into the lungs daily.   Yes [provider]  gabapentin (NEURONTIN) 300 MG capsule Take 600-1,200 mg by mouth See admin instructions. 600 mg in the morning, and 1200 mg at bedtime   Yes [provider]  hydrochlorothiazide (HYDRODIURIL) 25 MG tablet Take 25 mg by mouth daily.   Yes [provider]  loratadine (CLARITIN) 10 MG tablet Take 10 mg by mouth daily.   Yes [provider]  polyethylene glycol-electrolytes (NULYTELY) 420 g solution As directed 08/29/20  Yes Hurshel Keys K, DO  Potassium Chloride ER 20 MEQ TBCR Take 20 mEq daily by mouth.    Yes [provider]  levalbuterol (XOPENEX) 1.25 MG/3ML nebulizer solution Take 1.25 mg by nebulization every 4 (four) hours as needed for wheezing or shortness of breath. Patient not taking: Reported on 10/18/2020    [provider]  mupirocin ointment (BACTROBAN) 2 % Apply 1 application topically 2 (two) times daily as needed (sores).    [provider]    Allergies as of 08/29/2020 - Review Complete 08/29/2020  Allergen Reaction Noted   Cephalexin Shortness Of Breath 01/10/2011   Celecoxib Nausea And Vomiting 10/09/2014   Imipramine hcl Other (See Comments) 10/04/2013   Other Nausea And Vomiting 10/09/2014   Abilify [aripiprazole] Nausea And Vomiting 11/04/2014   Augmentin [amoxicillin-pot clavulanate] Nausea And Vomiting 09/25/2012   Keflex [cephalexin]  11/16/2016   Penicillins Nausea And Vomiting 09/25/2012  Seroquel [quetiapine fumarate]  11/04/2014   Aspirin Swelling and Rash 09/25/2012   Sulfa antibiotics Rash and Other (See Comments) 09/25/2012    Family History  Problem Relation Age of Onset   Diabetes Mother    Cancer Mother        breast    Hypertension Mother    Diverticulitis Mother        required surgery   Other Father        brain tumor, fluid around heart   Factor V Leiden deficiency Father    Drug abuse Sister    Other Brother        MVA   Cancer Maternal Grandmother    Diabetes  Maternal Grandfather    Cancer Paternal Grandfather    Cancer Other    Colon cancer Neg Hx    Liver disease Neg Hx     Social History   Socioeconomic History   Marital status: Single    Spouse name: Not on file   Number of children: Not on file   Years of education: Not on file   Highest education level: Not on file  Occupational History   Not on file  Tobacco Use   Smoking status: Every Day    Packs/day: 1.00    Years: 40.00    Pack years: 40.00    Types: Cigarettes   Smokeless tobacco: Never  Vaping Use   Vaping Use: Never used  Substance and Sexual Activity   Alcohol use: No   Drug use: Yes    Types: Marijuana    Comment: twice a week   Sexual activity: Not Currently    Birth control/protection: Surgical    Comment: hyst  Other Topics Concern   Not on file  Social History Narrative   Not on file   Social Determinants of Health   Financial Resource Strain: Low Risk    Difficulty of Paying Living Expenses: Not very hard  Food Insecurity: No Food Insecurity   Worried About Charity fundraiser in the Last Year: Never true   Ran Out of Food in the Last Year: Never true  Transportation Needs: No Transportation Needs   Lack of Transportation (Medical): No   Lack of Transportation (Non-Medical): No  Physical Activity: Sufficiently Active   Days of Exercise per Week: 7 days   Minutes of Exercise per Session: 100 min  Stress: No Stress Concern Present   Feeling of Stress : Not at all  Social Connections: Moderately Isolated   Frequency of Communication with Friends and Family: More than three times a week   Frequency of Social Gatherings with Friends and Family: Twice a week   Attends Religious Services: 1 to 4 times per year   Active Member of [employer] or Organizations: No   Attends Music therapist: Never   Marital Status: Never married  Human resources officer Violence: Not At Risk   Fear of Current or Ex-Partner: No   Emotionally Abused: No   Physically  Abused: No   Sexually Abused: No    Review of Systems: See HPI, otherwise negative ROS  Physical Exam: Vital signs in last 24 hours: Temp:  [98.2 F (36.8 C)] 98.2 F (36.8 C) (10/17 0715) Pulse Rate:  [72] 72 (10/17 0715) Resp:  [20] 20 (10/17 0715) BP: (131)/(80) 131/80 (10/17 0715) SpO2:  [98 %] 98 % (10/17 0715)   General:   Alert,  Well-developed, well-nourished, pleasant and cooperative in NAD Head:  Normocephalic and atraumatic. Eyes:  Sclera clear, no icterus.   Conjunctiva pink. Ears:  Normal auditory acuity. Nose:  No deformity, discharge,  or lesions. Mouth:  No deformity or lesions, dentition normal. Neck:  Supple; no masses or thyromegaly. Lungs:  Clear throughout to auscultation.   No wheezes, crackles, or rhonchi. No acute distress. Heart:  Regular rate and rhythm; no murmurs, clicks, rubs,  or gallops. Abdomen:  Soft, nontender and nondistended. No masses, hepatosplenomegaly or hernias noted. Normal bowel sounds, without guarding, and without rebound.   Msk:  Symmetrical without gross deformities. Normal posture. Extremities:  Without clubbing or edema. Neurologic:  Alert and  oriented x4;  grossly normal neurologically. Skin:  Intact without significant lesions or rashes. Cervical Nodes:  No significant cervical adenopathy. Psych:  Alert and cooperative. Normal mood and affect.  Impression/Plan: [name] is here for a colonoscopy to be performed for rectal bleeding  The risks of the procedure including infection, bleed, or perforation as well as benefits, limitations, alternatives and imponderables have been reviewed with the patient. Questions have been answered. All parties agreeable.

## 2020-10-22 NOTE — Anesthesia Preprocedure Evaluation (Signed)
Anesthesia Evaluation  Patient identified by MRN, date of birth, ID band Patient awake    Reviewed: Allergy & Precautions, NPO status , Patient's Chart, lab work & pertinent test results  History of Anesthesia Complications Negative for: history of anesthetic complications  Airway Mallampati: II  TM Distance: >3 FB Neck ROM: Full    Dental  (+) Edentulous Upper, Edentulous Lower   Pulmonary asthma , pneumonia, COPD,  COPD inhaler, Current SmokerPatient did not abstain from smoking.,    Pulmonary exam normal breath sounds clear to auscultation       Cardiovascular hypertension, Pt. on medications Normal cardiovascular exam Rhythm:Regular Rate:Normal     Neuro/Psych Seizures -, Well Controlled,  PSYCHIATRIC DISORDERS Bipolar Disorder    GI/Hepatic GERD  Medicated,(+) Cirrhosis     substance abuse  , Hepatitis -, C  Endo/Other  diabetes, Well Controlled, Type 2, Oral Hypoglycemic Agents  Renal/GU   Female GU complaint (Ccervical cancer)     Musculoskeletal  (+) Arthritis , Osteoarthritis,    Abdominal   Peds  Hematology  (+) Blood dyscrasia (Factor V leiden), ,   Anesthesia Other Findings Factor V leiden  Reproductive/Obstetrics                             Anesthesia Physical Anesthesia Plan  ASA: 3  Anesthesia Plan: General   Post-op Pain Management:    Induction: Intravenous  PONV Risk Score and Plan: TIVA  Airway Management Planned: Nasal Cannula and Natural Airway  Additional Equipment:   Intra-op Plan:   Post-operative Plan:   Informed Consent: I have reviewed the patients History and Physical, chart, labs and discussed the procedure including the risks, benefits and alternatives for the proposed anesthesia with the patient or authorized representative who has indicated his/her understanding and acceptance.     Dental advisory given  Plan Discussed with: CRNA and  Surgeon  Anesthesia Plan Comments:         Anesthesia Quick Evaluation

## 2020-10-23 LAB — SURGICAL PATHOLOGY

## 2020-10-24 ENCOUNTER — Encounter (HOSPITAL_COMMUNITY): Payer: Self-pay | Admitting: Internal Medicine

## 2021-02-08 ENCOUNTER — Other Ambulatory Visit: Payer: Self-pay

## 2021-02-08 ENCOUNTER — Ambulatory Visit (INDEPENDENT_AMBULATORY_CARE_PROVIDER_SITE_OTHER): Payer: Medicaid Other

## 2021-02-08 ENCOUNTER — Ambulatory Visit
Admission: EM | Admit: 2021-02-08 | Discharge: 2021-02-08 | Disposition: A | Discharge status: Home / Self Care | Payer: Medicaid Other | Attending: Family Medicine | Admitting: Family Medicine

## 2021-02-08 DIAGNOSIS — W19XXXA Unspecified fall, initial encounter: Secondary | ICD-10-CM | POA: Diagnosis not present

## 2021-02-08 DIAGNOSIS — M25522 Pain in left elbow: Secondary | ICD-10-CM

## 2021-02-08 DIAGNOSIS — S40022A Contusion of left upper arm, initial encounter: Secondary | ICD-10-CM | POA: Diagnosis not present

## 2021-02-08 DIAGNOSIS — M25512 Pain in left shoulder: Secondary | ICD-10-CM | POA: Diagnosis not present

## 2021-02-08 NOTE — ED Triage Notes (Signed)
Pt reports she slipped over a metal rail  around 16:00 today. Pt reports left shoulder pain and left elbow pain. States she can not raise left shoulder.

## 2021-02-08 NOTE — ED Provider Notes (Signed)
[facility]    CSN: [id number] Arrival date & time: 02/08/21  [place]      History   Chief Complaint Chief Complaint  Patient presents with   Fall        Shoulder Pain        Elbow Pain    HPI [name] is a 56 y.o. female.   Patient presenting today with left upper arm pain after falling into a metal dog cage earlier today.  She states the arm is so sore that she cannot bend at the elbow or lift the arm.  She denies numbness, tingling, weakness, discoloration, skin injury, deformity.  Has not taken anything thus far for pain.   Past Medical History:  Diagnosis Date   Asthma    Bipolar disease, chronic (HCC)    Cancer ([place])    cervical cancer. partial hysterectomy in 20s.    Cervical cancer (HCC)    COPD (chronic obstructive pulmonary disease) (HCC)    DDD (degenerative disc disease)    Diabetes mellitus    not on medication   Factor 5 Leiden mutation, heterozygous ([place])    GERD (gastroesophageal reflux disease)    Hep C w/o coma, chronic (HCC)    genotype 1 A without cirrhosis s/p treatment with Harvoni completed Jan 2018, HCV RNA not detected March 2018.   Hypertension    MRSA (methicillin resistant Staphylococcus aureus) carrier    Pneumonia    Seizures [facility])     Patient Active Problem List   Diagnosis Date Noted   History of hepatitis C 08/29/2020   Rectal bleeding 08/29/2020   Screening mammogram for breast cancer 05/22/2020   Smoker 05/22/2020   Encounter for screening fecal occult blood testing 05/22/2020   Encounter for gynecological examination with Papanicolaou smear of cervix 05/22/2020   S/P hysterectomy 05/22/2020   Screening for colorectal cancer 05/20/2017   Well woman exam with routine gynecological exam 05/20/2017   Vaginal Pap smear following hysterectomy for malignancy 05/20/2017   Constipation 03/27/2016   Right wrist pain 02/26/2016   Factor V Leiden ([place]) 10/04/2013    Past Surgical History:  Procedure Laterality  Date   ABDOMINAL HYSTERECTOMY     cervical cancer   BACK SURGERY     carpel tunnel     COLONOSCOPY  20 yrs   [place]   COLONOSCOPY WITH PROPOFOL N/A 10/22/2020   Procedure: COLONOSCOPY WITH PROPOFOL;  Surgeon: Eloise Harman, DO;  [facility];  Service: Endoscopy;  Laterality: N/A;  9:00am   MASS EXCISION     POLYPECTOMY  10/22/2020   Procedure: POLYPECTOMY;  Surgeon: Eloise Harman, DO;  Location: [facility];  Service: Endoscopy;;    OB History     Gravida  1   Para  1   Term  1   Preterm      AB      Living  1      SAB      IAB      Ectopic      Multiple      Live Births  1            Home Medications    Prior to Admission medications   Medication Sig Start Date End Date Taking? Authorizing Provider  acetaminophen (TYLENOL) 325 MG tablet Take 650 mg by mouth every 6 (six) hours as needed for moderate pain.    [provider]  albuterol (PROVENTIL HFA;VENTOLIN HFA) 108 (90  BASE) MCG/ACT inhaler Inhale 2 puffs into the lungs every 6 (six) hours as needed. For shortness of breath Patient not taking: Reported on 02/08/2021    [provider]  albuterol (PROVENTIL) (2.5 MG/3ML) 0.083% nebulizer solution Take 2.5 mg by nebulization 2 (two) times daily as needed for wheezing or shortness of breath.    [provider]  esomeprazole (NEXIUM) 20 MG capsule Take 20 mg by mouth daily. 09/12/20   [provider]  Fluticasone-Umeclidin-Vilant (TRELEGY ELLIPTA) 100-62.5-25 MCG/INH AEPB Inhale 1 puff into the lungs daily.    [provider]  gabapentin (NEURONTIN) 300 MG capsule Take 600-1,200 mg by mouth See admin instructions. 600 mg in the morning, and 1200 mg at bedtime    [provider]  hydrochlorothiazide (HYDRODIURIL) 25 MG tablet Take 25 mg by mouth daily.    [provider]  levalbuterol Penne Lash) 1.25 MG/3ML nebulizer solution Take 1.25 mg by nebulization every 4 (four) hours as  needed for wheezing or shortness of breath. Patient not taking: Reported on 10/18/2020    [provider]  loratadine (CLARITIN) 10 MG tablet Take 10 mg by mouth daily.    [provider]  mupirocin ointment (BACTROBAN) 2 % Apply 1 application topically 2 (two) times daily as needed (sores).    [provider]  polyethylene glycol-electrolytes (NULYTELY) 420 g solution As directed 08/29/20   Eloise Harman, DO  Potassium Chloride ER 20 MEQ TBCR Take 20 mEq daily by mouth.     [provider]    Family History Family History  Problem Relation Age of Onset   Diabetes Mother    Cancer Mother        breast    Hypertension Mother    Diverticulitis Mother        required surgery   Other Father        brain tumor, fluid around heart   Factor V Leiden deficiency Father    Drug abuse Sister    Other Brother        MVA   Cancer Maternal Grandmother    Diabetes Maternal Grandfather    Cancer Paternal Grandfather    Cancer Other    Colon cancer Neg Hx    Liver disease Neg Hx     Social History Social History   Tobacco Use   Smoking status: Every Day    Packs/day: 1.00    Years: 40.00    Pack years: 40.00    Types: Cigarettes   Smokeless tobacco: Never  Vaping Use   Vaping Use: Never used  Substance Use Topics   Alcohol use: No   Drug use: Yes    Types: Marijuana    Comment: twice a week     Allergies   Cephalexin, Celecoxib, Imipramine hcl, Other, Abilify [aripiprazole], Augmentin [amoxicillin-pot clavulanate], Keflex [cephalexin], Penicillins, Seroquel [quetiapine fumarate], Aspirin, and Sulfa antibiotics   Review of Systems Review of Systems Per HPI  Physical Exam Triage Vital Signs ED Triage Vitals  Enc Vitals Group     BP 02/08/21 1717 135/80     Pulse Rate 02/08/21 1717 88     Resp 02/08/21 1717 18     Temp 02/08/21 1717 98.5 F (36.9 C)     Temp Source 02/08/21 1717 Oral     SpO2 02/08/21 1717 95 %     Weight --       Height --      Head Circumference --      Peak  Flow --      Pain Score 02/08/21 1715 8     Pain Loc --      Pain Edu? --      Excl. in [place]? --    No data found.  Updated Vital Signs BP 135/80 (BP Location: Right Arm)    Pulse 88    Temp 98.5 F (36.9 C) (Oral)    Resp 18    SpO2 95%   Visual Acuity Right Eye Distance:   Left Eye Distance:   Bilateral Distance:    Right Eye Near:   Left Eye Near:    Bilateral Near:     Physical Exam Vitals and nursing note reviewed.  Constitutional:      Appearance: Normal appearance. She is not ill-appearing.  HENT:     Head: Atraumatic.  Eyes:     Extraocular Movements: Extraocular movements intact.     Conjunctiva/sclera: Conjunctivae normal.  Cardiovascular:     Rate and Rhythm: Normal rate and regular rhythm.     Heart sounds: Normal heart sounds.  Pulmonary:     Effort: Pulmonary effort is normal.     Breath sounds: Normal breath sounds.  Musculoskeletal:        General: Swelling, tenderness and signs of injury present. No deformity.     Cervical back: Normal range of motion and neck supple.     Comments: Range of motion exam limited due to severity of patient's left upper arm pain.  No bony deformity palpable of the left elbow, upper arm, shoulder and range of motion passively intact.  Tenderness, mild swelling noted to the area upper arm where she states the bar directly hit of the kennel.  Grip strength full and equal bilateral hands.  Skin:    General: Skin is warm and dry.     Findings: No bruising or erythema.  Neurological:     Mental Status: She is alert and oriented to person, place, and time.     Comments: Left upper extremity neurovascularly intact  Psychiatric:        Mood and Affect: Mood normal.        Thought Content: Thought content normal.        Judgment: Judgment normal.   UC Treatments / Results  Labs (all labs ordered are listed, but only abnormal results are displayed) Labs Reviewed - No data to  display  EKG  Radiology DG ELBOW COMPLETE LEFT (3+VIEW)  Result Date: 02/08/2021 CLINICAL DATA:  Pain after fall today. EXAM: LEFT ELBOW - COMPLETE 3+ VIEW COMPARISON:  None. FINDINGS: There is no evidence of fracture, dislocation, or joint effusion. Minimal spurring about the lateral humeral epicondyle. There is no evidence of arthropathy or other focal bone abnormality. Soft tissues are unremarkable. IMPRESSION: No fracture, dislocation, or joint effusion. Electronically Signed   By: Keith Rake M.D.   On: 02/08/2021 17:43   DG Shoulder Left  Result Date: 02/08/2021 CLINICAL DATA:  Fall today with left shoulder pain. EXAM: LEFT SHOULDER - 2+ VIEW COMPARISON:  Shoulder radiograph 11/16/2016 FINDINGS: There is no evidence of fracture or dislocation. Joint spaces are preserved. There is no evidence of arthropathy or other focal bone abnormality. Soft tissues are unremarkable. IMPRESSION: No fracture or subluxation of the left shoulder. Electronically Signed   By: Keith Rake M.D.   On: 02/08/2021 17:42    Procedures Procedures (including critical care time)  Medications Ordered in UC Medications - No data to display  Initial Impression / Assessment and  Plan / UC Course  I have reviewed the triage vital signs and the nursing notes.  Pertinent labs & imaging results that were available during my care of the patient were reviewed by me and considered in my medical decision making (see chart for details).     X-rays of the left shoulder negative for bony abnormality and on exam her pain symptoms are in the soft tissues of the left upper arm.  Suspect contusion of the arm.  Given her pain with range of motion, will place in a sling for comfort during activity but discussed to not wear it full-time and to make sure to stretch and gently move the extremity to keep it from stiffening up.  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   Tylenol, heat, Biofreeze, lidocaine patches for comfort.  Return for acutely worsening symptoms.  Final Clinical Impressions(s) / UC Diagnoses   Final diagnoses:  Contusion of left upper extremity, initial encounter   Discharge Instructions   None    ED Prescriptions   None    PDMP not reviewed this encounter.   Volney American, [place] 02/08/21 1827

## 2021-03-06 ENCOUNTER — Encounter: Payer: Medicaid Other | Admitting: Gastroenterology

## 2021-06-13 ENCOUNTER — Ambulatory Visit
Admission: EM | Admit: 2021-06-13 | Discharge: 2021-06-13 | Disposition: A | Discharge status: Home / Self Care | Payer: Medicaid Other | Attending: Family Medicine | Admitting: Family Medicine

## 2021-06-13 ENCOUNTER — Other Ambulatory Visit: Payer: Self-pay

## 2021-06-13 ENCOUNTER — Encounter: Payer: Self-pay | Admitting: Emergency Medicine

## 2021-06-13 DIAGNOSIS — M67431 Ganglion, right wrist: Secondary | ICD-10-CM

## 2021-06-13 MED ORDER — DEXAMETHASONE SODIUM PHOSPHATE 10 MG/ML IJ SOLN
10.0000 mg | Freq: Once | INTRAMUSCULAR | Status: AC
  Administered 2021-06-13: 10 mg via INTRAMUSCULAR

## 2021-06-13 NOTE — ED Triage Notes (Signed)
Pt reports cyst to right hand/wrist. Pt reports history of same and reports followed up with pcp but never heard back from referral until recently.   Pt reports site started out as a few "knots" to right hand and reports now is one large knot that is causing numbness in right hand and right forearm.   Pt reports "I was wondering if it could be drained until I follow up with surgeon to give me some day to day relief."

## 2021-06-13 NOTE — Discharge Instructions (Signed)
[name] Orthopedic clinic in [place], [place] in: [place] Address: [address] [place], [place], [place] [address] Hours:  Open ? Closes 8?PM Phone: [phone]   [facility] [address] [place], [place] [address] EVENINGS & WEEKENDS NO APPOINTMENT NECESSARY Mon-Fri  5:30PM - 9PM Sat 9 AM - 2 PM Sun 10 AM - 2 PM

## 2021-06-13 NOTE — ED Provider Notes (Signed)
[facility]    CSN: [id number] Arrival date & time: 06/13/21  1619      History   Chief Complaint Chief Complaint  Patient presents with   Cyst    HPI [name] is a 56 y.o. female.   Presenting today with 55-monthhistory of progressively worsening severe right wrist pain and cysts that have formed at the wrist joint.  She states she has had these in the past and they have waxed and waned.  She now has 3 or 4 of them all in a cluster and the pain is excruciating, radiating down toward her forearm and causing some numbness in the area.  She states she is been waiting for several months to get a referral to orthopedics from her primary care but this has not gone through.  She denies known injury to the area, fever, chills, discoloration.  Trying ibuprofen and Tylenol with minimal relief.    Past Medical History:  Diagnosis Date   Asthma    Bipolar disease, chronic (HCC)    [place]    cervical cancer. partial hysterectomy in 20s.    Cervical cancer (HCC)    COPD (chronic obstructive pulmonary disease) (HCC)    DDD (degenerative disc disease)    Diabetes mellitus    not on medication   Factor 5 Leiden mutation, heterozygous ([place]    GERD (gastroesophageal reflux disease)    Hep C w/o coma, chronic (HCC)    genotype 1 A without cirrhosis s/p treatment with Harvoni completed Jan 2018, HCV RNA not detected March 2018.   Hypertension    MRSA (methicillin resistant Staphylococcus aureus) carrier    Pneumonia    Seizures ([facility]     Patient Active Problem List   Diagnosis Date Noted   History of hepatitis C 08/29/2020   Rectal bleeding 08/29/2020   Screening mammogram for breast cancer 05/22/2020   Smoker 05/22/2020   Encounter for screening fecal occult blood testing 05/22/2020   Encounter for gynecological examination with Papanicolaou smear of cervix 05/22/2020   S/P hysterectomy 05/22/2020   Screening for colorectal cancer 05/20/2017   Well woman  exam with routine gynecological exam 05/20/2017   Vaginal Pap smear following hysterectomy for malignancy 05/20/2017   Constipation 03/27/2016   Right wrist pain 02/26/2016   Factor V Leiden ([place] 10/04/2013    Past Surgical History:  Procedure Laterality Date   ABDOMINAL HYSTERECTOMY     cervical cancer   BACK SURGERY     carpel tunnel     COLONOSCOPY  20 yrs   [place]   COLONOSCOPY WITH PROPOFOL N/A 10/22/2020   Procedure: COLONOSCOPY WITH PROPOFOL;  Surgeon: CEloise Harman DO;  [facility];  Service: Endoscopy;  Laterality: N/A;  9:00am   MASS EXCISION     POLYPECTOMY  10/22/2020   Procedure: POLYPECTOMY;  Surgeon: CEloise Harman DO;  [facility];  Service: Endoscopy;;    OB History     Gravida  1   Para  1   Term  1   Preterm      AB      Living  1      SAB      IAB      Ectopic      Multiple      Live Births  1            Home Medications    Prior to Admission medications   Medication Sig  Start Date End Date Taking? Authorizing Provider  acetaminophen (TYLENOL) 325 MG tablet Take 650 mg by mouth every 6 (six) hours as needed for moderate pain.    [provider]  albuterol (PROVENTIL HFA;VENTOLIN HFA) 108 (90 BASE) MCG/ACT inhaler Inhale 2 puffs into the lungs every 6 (six) hours as needed. For shortness of breath Patient not taking: Reported on 02/08/2021    [provider]  albuterol (PROVENTIL) (2.5 MG/3ML) 0.083% nebulizer solution Take 2.5 mg by nebulization 2 (two) times daily as needed for wheezing or shortness of breath.    [provider]  esomeprazole (NEXIUM) 20 MG capsule Take 20 mg by mouth daily. 09/12/20   [provider]  Fluticasone-Umeclidin-Vilant (TRELEGY ELLIPTA) 100-62.5-25 MCG/INH AEPB Inhale 1 puff into the lungs daily.    [provider]  gabapentin (NEURONTIN) 300 MG capsule Take 600-1,200 mg by mouth See admin instructions. 600 mg in the morning,  and 1200 mg at bedtime    [provider]  hydrochlorothiazide (HYDRODIURIL) 25 MG tablet Take 25 mg by mouth daily.    [provider]  levalbuterol Penne Lash) 1.25 MG/3ML nebulizer solution Take 1.25 mg by nebulization every 4 (four) hours as needed for wheezing or shortness of breath. Patient not taking: Reported on 10/18/2020    [provider]  loratadine (CLARITIN) 10 MG tablet Take 10 mg by mouth daily.    [provider]  mupirocin ointment (BACTROBAN) 2 % Apply 1 application topically 2 (two) times daily as needed (sores).    [provider]  polyethylene glycol-electrolytes (NULYTELY) 420 g solution As directed 08/29/20   Eloise Harman, DO  Potassium Chloride ER 20 MEQ TBCR Take 20 mEq daily by mouth.     [provider]    Family History Family History  Problem Relation Age of Onset   Diabetes Mother    Cancer Mother        breast    Hypertension Mother    Diverticulitis Mother        required surgery   Other Father        brain tumor, fluid around heart   Factor V Leiden deficiency Father    Drug abuse Sister    Other Brother        MVA   Cancer Maternal Grandmother    Diabetes Maternal Grandfather    Cancer Paternal Grandfather    Cancer Other    Colon cancer Neg Hx    Liver disease Neg Hx     Social History Social History   Tobacco Use   Smoking status: Every Day    Packs/day: 1.00    Years: 40.00    Total pack years: 40.00    Types: Cigarettes   Smokeless tobacco: Never  Vaping Use   Vaping Use: Never used  Substance Use Topics   Alcohol use: No   Drug use: Yes    Types: Marijuana    Comment: twice a week     Allergies   Cephalexin, Celecoxib, Imipramine hcl, Other, Abilify [aripiprazole], Augmentin [amoxicillin-pot clavulanate], Keflex [cephalexin], Penicillins, Seroquel [quetiapine fumarate], Aspirin, and Sulfa antibiotics   Review of Systems Review of Systems Per HPI  Physical  Exam Triage Vital Signs ED Triage Vitals [06/13/21 1711]  Enc Vitals Group     BP (!) 159/95     Pulse Rate (!) 106     Resp 18     Temp 97.8 F (36.6 C)     Temp Source Oral  SpO2 98 %     Weight 180 lb (81.6 kg)     Height '5\' 1"'$  (1.549 m)     Head Circumference      Peak Flow      Pain Score 5     Pain Loc      Pain Edu?      Excl. in [place]?    No data found.  Updated Vital Signs BP (!) 159/95 (BP Location: Right Arm)   Pulse (!) 106   Temp 97.8 F (36.6 C) (Oral)   Resp 18   Ht '5\' 1"'$  (1.549 m)   Wt 180 lb (81.6 kg)   SpO2 98%   BMI 34.01 kg/m   Visual Acuity Right Eye Distance:   Left Eye Distance:   Bilateral Distance:    Right Eye Near:   Left Eye Near:    Bilateral Near:     Physical Exam Vitals and nursing note reviewed.  Constitutional:      Appearance: Normal appearance. She is not ill-appearing.  HENT:     Head: Atraumatic.  Eyes:     Extraocular Movements: Extraocular movements intact.     Conjunctiva/sclera: Conjunctivae normal.  Cardiovascular:     Rate and Rhythm: Normal rate and regular rhythm.     Heart sounds: Normal heart sounds.  Pulmonary:     Effort: Pulmonary effort is normal.     Breath sounds: Normal breath sounds.  Musculoskeletal:     Cervical back: Normal range of motion and neck supple.     Comments: Guarded range of motion in the right wrist due to pain from significant cluster of ganglion cysts.  3-4 cysts are all in a cluster at the right wrist joint line.  Severe tenderness to palpation  Skin:    General: Skin is warm and dry.  Neurological:     Mental Status: She is alert and oriented to person, place, and time.     Comments: Right hand neurovascularly intact  Psychiatric:        Mood and Affect: Mood normal.        Thought Content: Thought content normal.        Judgment: Judgment normal.    UC Treatments / Results  Labs (all labs ordered are listed, but only abnormal results are displayed) Labs Reviewed - No  data to display  EKG   Radiology No results found.  Procedures Procedures (including critical care time)  Medications Ordered in UC Medications  dexamethasone (DECADRON) injection 10 mg (10 mg Intramuscular Given by Other 06/13/21 1733)    Initial Impression / Assessment and Plan / UC Course  I have reviewed the triage vital signs and the nursing notes.  Pertinent labs & imaging results that were available during my care of the patient were reviewed by me and considered in my medical decision making (see chart for details).     Discussed at length options of attempting aspiration on at least one of the cysts versus medication and close orthopedic follow-up.  Risks and benefits of all options discussed and patient opting for IM Decadron and orthopedic urgent care information.  Continue over-the-counter pain relievers as needed.  Information given for multiple orthopedic urgent cares in the area.  Return for worsening symptoms.  Final Clinical Impressions(s) / UC Diagnoses   Final diagnoses:  Ganglion cyst of wrist, right     Discharge Instructions      [facility] Orthopedic clinic in [place], [place] in:  [facility] Address: [address] [place], [place], [place] [address] Hours:  Open ? Closes 8?PM Phone: [phone]   [facility] [address] [place], [place] [address] EVENINGS & WEEKENDS NO APPOINTMENT NECESSARY Mon-Fri  5:30PM - 9PM Sat 9 AM - 2 PM Sun 10 AM - 2 PM    ED Prescriptions   None    PDMP not reviewed this encounter.   Volney American, [place] 06/13/21 1750

## 2021-06-20 ENCOUNTER — Encounter: Payer: Self-pay | Admitting: Orthopedic Surgery

## 2021-06-25 ENCOUNTER — Other Ambulatory Visit (HOSPITAL_COMMUNITY): Payer: Self-pay | Admitting: Internal Medicine

## 2021-06-25 DIAGNOSIS — Z1231 Encounter for screening mammogram for malignant neoplasm of breast: Secondary | ICD-10-CM

## 2021-07-03 ENCOUNTER — Ambulatory Visit (HOSPITAL_COMMUNITY)
Admission: RE | Admit: 2021-07-03 | Discharge: 2021-07-03 | Disposition: A | Discharge status: Home / Self Care | Payer: Medicaid Other | Source: Ambulatory Visit | Attending: Internal Medicine | Admitting: Internal Medicine

## 2021-07-03 DIAGNOSIS — Z1231 Encounter for screening mammogram for malignant neoplasm of breast: Secondary | ICD-10-CM | POA: Insufficient documentation

## 2021-07-16 ENCOUNTER — Encounter: Payer: Self-pay | Admitting: Orthopedic Surgery

## 2021-07-16 ENCOUNTER — Ambulatory Visit (INDEPENDENT_AMBULATORY_CARE_PROVIDER_SITE_OTHER): Payer: Medicaid Other | Admitting: Orthopedic Surgery

## 2021-07-16 VITALS — BP 153/92 | HR 88 | Ht 61.0 in | Wt 177.0 lb

## 2021-07-16 DIAGNOSIS — M25531 Pain in right wrist: Secondary | ICD-10-CM | POA: Diagnosis not present

## 2021-07-16 DIAGNOSIS — M67431 Ganglion, right wrist: Secondary | ICD-10-CM

## 2021-07-16 NOTE — Progress Notes (Signed)
Orthopaedic Clinic Return  Assessment: [name] is a 56 y.o. female with the following: Recurrent right wrist, dorsal ganglion cyst  Plan: [name] has had multiple procedures on the right wrist, with excision of a dorsal ganglion cyst.  The swelling has returned.  He has some associated numbness distal to the cyst, without obvious injury.  Given the nature of her injury, and multiple excisions, she should return to Dr. Burney Gauze for definitive treatment.  I am happy to continue to aspirate the cyst if this helps with her symptoms.  We aspirated the cyst today.  Nothing else needed at this time.  We fitted her for a brace, and will attempt to immobilize the wrist, and help prevent recurrence.  Follow-up in clinic as needed.  Procedure note - Ganglion Cyst Aspiration - Dorsal Right Wrist   Verbal consent was obtained to aspirate a ganglion cyst  Timeout was completed to confirm the site of aspiration. The skin was prepped with alcohol and ethyl chloride was sprayed at the injection site.  An 18-gauge needle was used to aspirate 1 cc of thick gelatinous cyst material.  We were able to express additional fluid upon removal of the needle. There were no complications.  A sterile bandage was applied.   Follow-up: No follow-ups on file.   Subjective:  Chief Complaint  Patient presents with   Wrist Pain    Rt wrist nodule for 3 yrs. Has gotten larger in past 6 mos that's caused numbness to the top of the hand. Pt states she has had cyst removed x2 in the past and it's come back.     History of Present Illness: [name] is a 56 y.o. female who returns to clinic for evaluation of the swelling.  She has a history of multiple dorsal ganglion cyst excisions in the right wrist previously completed by Dr. Burney Gauze.  The last was approximately 5 years ago.  Over the past 6 months, she has noticed recurrence of swelling.  She states that she has some numbness distal to the cyst.   Some pain in her wrist as result of the growth.    Review of Systems: No fevers or chills + numbness and tingling No chest pain No shortness of breath No bowel or bladder dysfunction No GI distress No headaches   Objective: BP (!) 153/92   Pulse 88   Ht '5\' 1"'$  (1.549 m)   Wt 177 lb (80.3 kg)   BMI 33.44 kg/m   Physical Exam:  Evaluation of the dorsal wrist demonstrates well-healed incisions.  These are barely noticeable.  There is a swelling on the radial aspect of the wrist, just distal to the wrist crease.  Mild tenderness to palpation in this area.  It is firm but compressible.  Negative Tinel's.  Slightly decreased sensation over the second webspace.  No overlying skin changes.  No atrophy throughout the hand.  She does have some arthritic nodules on her fingers.  IMAGING: I personally ordered and reviewed the following images:  No new imaging obtained today.  Mordecai Rasmussen, MD 07/16/2021 9:52 AM

## 2021-12-16 ENCOUNTER — Ambulatory Visit: Payer: Self-pay

## 2021-12-25 ENCOUNTER — Other Ambulatory Visit (HOSPITAL_COMMUNITY): Payer: Self-pay | Admitting: Internal Medicine

## 2021-12-25 ENCOUNTER — Ambulatory Visit (HOSPITAL_COMMUNITY)
Admission: RE | Admit: 2021-12-25 | Discharge: 2021-12-25 | Disposition: A | Discharge status: Home / Self Care | Payer: Medicaid Other | Source: Ambulatory Visit | Attending: Internal Medicine | Admitting: Internal Medicine

## 2021-12-25 DIAGNOSIS — M25512 Pain in left shoulder: Secondary | ICD-10-CM | POA: Diagnosis present

## 2022-01-23 ENCOUNTER — Ambulatory Visit
Admission: EM | Admit: 2022-01-23 | Discharge: 2022-01-23 | Disposition: A | Discharge status: Home / Self Care | Payer: Medicaid Other

## 2022-01-23 ENCOUNTER — Other Ambulatory Visit: Payer: Self-pay

## 2022-01-23 ENCOUNTER — Encounter: Payer: Self-pay | Admitting: Emergency Medicine

## 2022-01-23 DIAGNOSIS — S0502XA Injury of conjunctiva and corneal abrasion without foreign body, left eye, initial encounter: Secondary | ICD-10-CM

## 2022-01-23 MED ORDER — ERYTHROMYCIN 5 MG/GM OP OINT: TOPICAL_OINTMENT | OPHTHALMIC | 0 refills | Status: DC

## 2022-01-23 NOTE — ED Triage Notes (Signed)
Pt reports was splitting wood with a wedge and sledge hammer and reports felt something "go into" left eye.   Pt reports continuous drainage, pain to open, and "milky film" over left eye.   Reports increased pain with eye movement and states has had "cataract implants" in both eyes.

## 2022-01-23 NOTE — ED Provider Notes (Signed)
[facility]    CSN: [id number] Arrival date & time: 01/23/22  1728      History   Chief Complaint Chief Complaint  Patient presents with   Foreign Body in Eye    HPI [name] is a 57 y.o. female.   Patient presenting today with left eye pain, discharge, inability to open the eye since cutting wood and getting a piece of wood into the eye.  She states it has been draining, painful to open and feels like there is a milky film over the eye since it occurred.  Denies discoloration, complete loss of vision, headache, nausea, vomiting.  So far has not tried anything for symptoms.  Had a past injury to this eye where a hot asphalt got into it and states it felt similar to this.    Past Medical History:  Diagnosis Date   Asthma    Bipolar disease, chronic (HCC)    Cancer ([place])    cervical cancer. partial hysterectomy in 20s.    Cervical cancer (HCC)    COPD (chronic obstructive pulmonary disease) (HCC)    DDD (degenerative disc disease)    Diabetes mellitus    not on medication   Factor 5 Leiden mutation, heterozygous ([place])    GERD (gastroesophageal reflux disease)    Hep C w/o coma, chronic (HCC)    genotype 1 A without cirrhosis s/p treatment with Harvoni completed Jan 2018, HCV RNA not detected March 2018.   Hypertension    MRSA (methicillin resistant Staphylococcus aureus) carrier    Pneumonia    Seizures [facility])     Patient Active Problem List   Diagnosis Date Noted   History of hepatitis C 08/29/2020   Rectal bleeding 08/29/2020   Screening mammogram for breast cancer 05/22/2020   Smoker 05/22/2020   Encounter for screening fecal occult blood testing 05/22/2020   Encounter for gynecological examination with Papanicolaou smear of cervix 05/22/2020   S/P hysterectomy 05/22/2020   Screening for colorectal cancer 05/20/2017   Well woman exam with routine gynecological exam 05/20/2017   Vaginal Pap smear following hysterectomy for malignancy  05/20/2017   Constipation 03/27/2016   Right wrist pain 02/26/2016   Factor V Leiden ([employer]) 10/04/2013    Past Surgical History:  Procedure Laterality Date   ABDOMINAL HYSTERECTOMY     cervical cancer   BACK SURGERY     carpel tunnel     COLONOSCOPY  20 yrs   [place]   COLONOSCOPY WITH PROPOFOL N/A 10/22/2020   Procedure: COLONOSCOPY WITH PROPOFOL;  Surgeon: Eloise Harman, DO;  [facility];  Service: Endoscopy;  Laterality: N/A;  9:00am   MASS EXCISION     POLYPECTOMY  10/22/2020   Procedure: POLYPECTOMY;  Surgeon: Eloise Harman, DO;  [facility];  Service: Endoscopy;;    OB History     Gravida  1   Para  1   Term  1   Preterm      AB      Living  1      SAB      IAB      Ectopic      Multiple      Live Births  1            Home Medications    Prior to Admission medications   Medication Sig Start Date End Date Taking? Authorizing Provider  erythromycin ophthalmic ointment Place a 1/2 inch ribbon of ointment into  the left lower eyelid BID prn. 01/23/22  Yes Volney American, PA-C  metFORMIN (GLUCOPHAGE) 500 MG tablet Take 300 mg by mouth 2 (two) times daily with a meal.   Yes [provider]  acetaminophen (TYLENOL) 325 MG tablet Take 650 mg by mouth every 6 (six) hours as needed for moderate pain.    [provider]  albuterol (PROVENTIL HFA;VENTOLIN HFA) 108 (90 BASE) MCG/ACT inhaler Inhale 2 puffs into the lungs every 6 (six) hours as needed. For shortness of breath    [provider]  albuterol (PROVENTIL) (2.5 MG/3ML) 0.083% nebulizer solution Take 2.5 mg by nebulization 2 (two) times daily as needed for wheezing or shortness of breath.    [provider]  esomeprazole (NEXIUM) 20 MG capsule Take 20 mg by mouth daily. 09/12/20   [provider]  Fluticasone-Umeclidin-Vilant (TRELEGY ELLIPTA) 100-62.5-25 MCG/INH AEPB Inhale 1 puff into the lungs daily.    [provider]  gabapentin (NEURONTIN) 300 MG capsule Take 600-1,200 mg by mouth See admin instructions. 600 mg in the morning, and 1200 mg at bedtime    [provider]  hydrochlorothiazide (HYDRODIURIL) 25 MG tablet Take 25 mg by mouth daily.    [provider]  levalbuterol Penne Lash) 1.25 MG/3ML nebulizer solution Take 1.25 mg by nebulization every 4 (four) hours as needed for wheezing or shortness of breath. Patient not taking: Reported on 10/18/2020    [provider]  loratadine (CLARITIN) 10 MG tablet Take 10 mg by mouth daily.    [provider]  mupirocin ointment (BACTROBAN) 2 % Apply 1 application topically 2 (two) times daily as needed (sores).    [provider]  polyethylene glycol-electrolytes (NULYTELY) 420 g solution As directed 08/29/20   Eloise Harman, DO  Potassium Chloride ER 20 MEQ TBCR Take 20 mEq daily by mouth.     [provider]    Family History Family History  Problem Relation Age of Onset   Diabetes Mother    Cancer Mother        breast    Hypertension Mother    Diverticulitis Mother        required surgery   Other Father        brain tumor, fluid around heart   Factor V Leiden deficiency Father    Drug abuse Sister    Other Brother        MVA   Cancer Maternal Grandmother    Diabetes Maternal Grandfather    Cancer Paternal Grandfather    Cancer Other    Colon cancer Neg Hx    Liver disease Neg Hx     Social History Social History   Tobacco Use   Smoking status: Every Day    Packs/day: 1.00    Years: 40.00    Total pack years: 40.00    Types: Cigarettes   Smokeless tobacco: Never  Vaping Use   Vaping Use: Never used  Substance Use Topics   Alcohol use: No   Drug use: Yes    Types: Marijuana    Comment: twice a week     Allergies   Cephalexin, Celecoxib, Imipramine hcl, Other, Abilify [aripiprazole], Augmentin [amoxicillin-pot clavulanate], Keflex [cephalexin], Penicillins,  Seroquel [quetiapine fumarate], Aspirin, and Sulfa antibiotics   Review of Systems Review of Systems Per HPI  Physical Exam Triage Vital Signs ED Triage Vitals  Enc Vitals Group     BP 01/23/22 1754 (!) 146/88     Pulse Rate 01/23/22  1754 94     Resp 01/23/22 1754 16     Temp 01/23/22 1754 97.8 F (36.6 C)     Temp Source 01/23/22 1754 Oral     SpO2 01/23/22 1754 93 %     Weight --      Height --      Head Circumference --      Peak Flow --      Pain Score 01/23/22 1757 10     Pain Loc --      Pain Edu? --      Excl. in [place]? --    No data found.  Updated Vital Signs BP (!) 146/88 (BP Location: Right Arm)   Pulse 94   Temp 97.8 F (36.6 C) (Oral)   Resp 16   SpO2 93%   Visual Acuity Right Eye Distance: 20/30 Left Eye Distance:  (unable to see until pt gets close up to eye chart. pt reports "everything has a milky appearance." PA aware and reported to continue with discharge.) Bilateral Distance: 20/16  Right Eye Near:   Left Eye Near:    Bilateral Near:     Physical Exam Vitals and nursing note reviewed.  Constitutional:      Appearance: Normal appearance. She is not ill-appearing.  HENT:     Head: Atraumatic.  Eyes:     Extraocular Movements: Extraocular movements intact.     Pupils: Pupils are equal, round, and reactive to light.     Comments: No foreign body appreciable on exam, area of increased uptake to the lateral aspect of the iris of the left eye on fluorescein stain which she endorses is the area of her pain  Cardiovascular:     Rate and Rhythm: Normal rate and regular rhythm.     Heart sounds: Normal heart sounds.  Pulmonary:     Effort: Pulmonary effort is normal.     Breath sounds: Normal breath sounds.  Musculoskeletal:        General: Normal range of motion.     Cervical back: Normal range of motion and neck supple.  Skin:    General: Skin is warm and dry.  Neurological:     Mental Status: She is alert and oriented to person, place, and  time.  Psychiatric:        Mood and Affect: Mood normal.        Thought Content: Thought content normal.        Judgment: Judgment normal.      UC Treatments / Results  Labs (all labs ordered are listed, but only abnormal results are displayed) Labs Reviewed - No data to display  EKG   Radiology No results found.  Procedures Procedures (including critical care time)  Medications Ordered in UC Medications - No data to display  Initial Impression / Assessment and Plan / UC Course  I have reviewed the triage vital signs and the nursing notes.  Pertinent labs & imaging results that were available during my care of the patient were reviewed by me and considered in my medical decision making (see chart for details).     Significant improvement after tetracaine drops were applied, patient able to open eye and complete thorough exam.  She is positive for a corneal abrasion which would explain her symptoms.  Treat with erythromycin ointment, compresses, over-the-counter pain relievers and follow-up with ophthalmology if not resolving.  Final Clinical Impressions(s) / UC Diagnoses   Final diagnoses:  Abrasion of left cornea, initial encounter  Discharge Instructions   None    ED Prescriptions     Medication Sig Dispense Auth. Provider   erythromycin ophthalmic ointment Place a 1/2 inch ribbon of ointment into the left lower eyelid BID prn. 3.5 g Volney American, PA-C      PDMP not reviewed this encounter.   Volney American, [place] 01/23/22 [phone]

## 2022-03-06 ENCOUNTER — Encounter: Payer: Self-pay | Admitting: Radiology

## 2022-03-21 ENCOUNTER — Ambulatory Visit
Admission: RE | Admit: 2022-03-21 | Discharge: 2022-03-21 | Disposition: A | Discharge status: Home / Self Care | Payer: Medicaid Other | Source: Ambulatory Visit | Attending: Family Medicine | Admitting: Family Medicine

## 2022-03-21 VITALS — BP 138/82 | HR 111 | Temp 98.6°F | Resp 22

## 2022-03-21 DIAGNOSIS — R059 Cough, unspecified: Secondary | ICD-10-CM | POA: Diagnosis present

## 2022-03-21 DIAGNOSIS — Z1152 Encounter for screening for COVID-19: Secondary | ICD-10-CM | POA: Diagnosis not present

## 2022-03-21 DIAGNOSIS — J441 Chronic obstructive pulmonary disease with (acute) exacerbation: Secondary | ICD-10-CM | POA: Insufficient documentation

## 2022-03-21 DIAGNOSIS — J069 Acute upper respiratory infection, unspecified: Secondary | ICD-10-CM | POA: Insufficient documentation

## 2022-03-21 LAB — POCT INFLUENZA A/B
Influenza A, POC: NEGATIVE
Influenza B, POC: NEGATIVE

## 2022-03-21 MED ORDER — GUAIFENESIN ER 600 MG PO TB12: 600.0000 mg | ORAL_TABLET | Freq: Two times a day (BID) | ORAL | 0 refills | Status: DC

## 2022-03-21 MED ORDER — PREDNISONE 20 MG PO TABS: 40.0000 mg | ORAL_TABLET | Freq: Every day | ORAL | 0 refills | Status: DC

## 2022-03-21 MED ORDER — IPRATROPIUM-ALBUTEROL 0.5-2.5 (3) MG/3ML IN SOLN: 3.0000 mL | RESPIRATORY_TRACT | 0 refills | Status: DC | PRN

## 2022-03-21 MED ORDER — PROMETHAZINE-DM 6.25-15 MG/5ML PO SYRP: 5.0000 mL | ORAL_SOLUTION | Freq: Four times a day (QID) | ORAL | 0 refills | Status: DC | PRN

## 2022-03-21 NOTE — ED Provider Notes (Signed)
[facility]    CSN: [id number] Arrival date & time: 03/21/22  1325      History   Chief Complaint Chief Complaint  Patient presents with   Fever    Entered by patient   Appointment    1330    HPI [name] is a 58 y.o. female.   Patient presenting today with 5-day history of hacking cough, wheezing, chest tightness, congestion, chills, body aches, fever.  Denies chest pain, significant shortness of breath, abdominal pain, nausea vomiting or diarrhea.  So far taking Tylenol, Symbicort for her COPD with minimal relief.  Multiple sick contacts recently but unsure with what.    Past Medical History:  Diagnosis Date   Asthma    Bipolar disease, chronic (HCC)    Cancer ([place])    cervical cancer. partial hysterectomy in 20s.    Cervical cancer (HCC)    COPD (chronic obstructive pulmonary disease) (HCC)    DDD (degenerative disc disease)    Diabetes mellitus    not on medication   Factor 5 Leiden mutation, heterozygous ([place])    GERD (gastroesophageal reflux disease)    Hep C w/o coma, chronic (HCC)    genotype 1 A without cirrhosis s/p treatment with Harvoni completed Jan 2018, HCV RNA not detected March 2018.   Hypertension    MRSA (methicillin resistant Staphylococcus aureus) carrier    Pneumonia    Seizures [facility])     Patient Active Problem List   Diagnosis Date Noted   History of hepatitis C 08/29/2020   Rectal bleeding 08/29/2020   Screening mammogram for breast cancer 05/22/2020   Smoker 05/22/2020   Encounter for screening fecal occult blood testing 05/22/2020   Encounter for gynecological examination with Papanicolaou smear of cervix 05/22/2020   S/P hysterectomy 05/22/2020   Screening for colorectal cancer 05/20/2017   Well woman exam with routine gynecological exam 05/20/2017   Vaginal Pap smear following hysterectomy for malignancy 05/20/2017   Constipation 03/27/2016   Right wrist pain 02/26/2016   Factor V Leiden ([place]) 10/04/2013     Past Surgical History:  Procedure Laterality Date   ABDOMINAL HYSTERECTOMY     cervical cancer   BACK SURGERY     carpel tunnel     COLONOSCOPY  20 yrs   [place]   COLONOSCOPY WITH PROPOFOL N/A 10/22/2020   Procedure: COLONOSCOPY WITH PROPOFOL;  Surgeon: Eloise Harman, DO;  [facility];  Service: Endoscopy;  Laterality: N/A;  9:00am   MASS EXCISION     POLYPECTOMY  10/22/2020   Procedure: POLYPECTOMY;  Surgeon: Eloise Harman, DO;  [facility];  Service: Endoscopy;;    OB History     Gravida  1   Para  1   Term  1   Preterm      AB      Living  1      SAB      IAB      Ectopic      Multiple      Live Births  1            Home Medications    Prior to Admission medications   Medication Sig Start Date End Date Taking? Authorizing Provider  guaiFENesin (MUCINEX) 600 MG 12 hr tablet Take 1 tablet (600 mg total) by mouth 2 (two) times daily. 03/21/22  Yes Volney American, PA-C  ipratropium-albuterol (DUONEB) 0.5-2.5 (3) MG/3ML SOLN Take 3 mLs by nebulization every  4 (four) hours as needed. 03/21/22  Yes Volney American, PA-C  predniSONE (DELTASONE) 20 MG tablet Take 2 tablets (40 mg total) by mouth daily with breakfast. 03/21/22  Yes Volney American, PA-C  promethazine-dextromethorphan (PROMETHAZINE-DM) 6.25-15 MG/5ML syrup Take 5 mLs by mouth 4 (four) times daily as needed. 03/21/22  Yes Volney American, PA-C  acetaminophen (TYLENOL) 325 MG tablet Take 650 mg by mouth every 6 (six) hours as needed for moderate pain.    [provider]  albuterol (PROVENTIL HFA;VENTOLIN HFA) 108 (90 BASE) MCG/ACT inhaler Inhale 2 puffs into the lungs every 6 (six) hours as needed. For shortness of breath    [provider]  albuterol (PROVENTIL) (2.5 MG/3ML) 0.083% nebulizer solution Take 2.5 mg by nebulization 2 (two) times daily as needed for wheezing or shortness of breath.    [provider]  erythromycin ophthalmic ointment Place a 1/2 inch ribbon of ointment into the left lower eyelid BID prn. 01/23/22   Volney American, PA-C  esomeprazole (NEXIUM) 20 MG capsule Take 20 mg by mouth daily. 09/12/20   [provider]  Fluticasone-Umeclidin-Vilant (TRELEGY ELLIPTA) 100-62.5-25 MCG/INH AEPB Inhale 1 puff into the lungs daily.    [provider]  gabapentin (NEURONTIN) 300 MG capsule Take 600-1,200 mg by mouth See admin instructions. 600 mg in the morning, and 1200 mg at bedtime    [provider]  hydrochlorothiazide (HYDRODIURIL) 25 MG tablet Take 25 mg by mouth daily.    [provider]  levalbuterol Penne Lash) 1.25 MG/3ML nebulizer solution Take 1.25 mg by nebulization every 4 (four) hours as needed for wheezing or shortness of breath. Patient not taking: Reported on 10/18/2020    [provider]  loratadine (CLARITIN) 10 MG tablet Take 10 mg by mouth daily.    [provider]  metFORMIN (GLUCOPHAGE) 500 MG tablet Take 300 mg by mouth 2 (two) times daily with a meal.    [provider]  mupirocin ointment (BACTROBAN) 2 % Apply 1 application topically 2 (two) times daily as needed (sores).    [provider]  polyethylene glycol-electrolytes (NULYTELY) 420 g solution As directed 08/29/20   Eloise Harman, DO  Potassium Chloride ER 20 MEQ TBCR Take 20 mEq daily by mouth.     [provider]    Family History Family History  Problem Relation Age of Onset   Diabetes Mother    Cancer Mother        breast    Hypertension Mother    Diverticulitis Mother        required surgery   Other Father        brain tumor, fluid around heart   Factor V Leiden deficiency Father    Drug abuse Sister    Other Brother        MVA   Cancer Maternal Grandmother    Diabetes Maternal Grandfather    Cancer Paternal Grandfather    Cancer Other    Colon cancer Neg Hx    Liver disease Neg Hx     Social  History Social History   Tobacco Use   Smoking status: Every Day    Packs/day: 1.00    Years: 40.00    Additional pack years: 0.00    Total pack years: 40.00    Types: Cigarettes   Smokeless tobacco: Never  Vaping Use   Vaping Use: Never used  Substance Use Topics   Alcohol use: No   Drug use:  Yes    Types: Marijuana    Comment: twice a week     Allergies   Cephalexin, Celecoxib, Imipramine hcl, Other, Abilify [aripiprazole], Augmentin [amoxicillin-pot clavulanate], Keflex [cephalexin], Penicillins, Seroquel [quetiapine fumarate], Aspirin, and Sulfa antibiotics   Review of Systems Review of Systems Per HPI  Physical Exam Triage Vital Signs ED Triage Vitals  Enc Vitals Group     BP 03/21/22 1356 138/82     Pulse Rate 03/21/22 1356 (!) 111     Resp 03/21/22 1356 (!) 22     Temp 03/21/22 1356 98.6 F (37 C)     Temp Source 03/21/22 1356 Oral     SpO2 03/21/22 1356 96 %     Weight --      Height --      Head Circumference --      Peak Flow --      Pain Score 03/21/22 1355 0     Pain Loc --      Pain Edu? --      Excl. in [place]? --    No data found.  Updated Vital Signs BP 138/82 (BP Location: Right Arm)   Pulse (!) 111   Temp 98.6 F (37 C) (Oral)   Resp (!) 22   SpO2 96%   Visual Acuity Right Eye Distance:   Left Eye Distance:   Bilateral Distance:    Right Eye Near:   Left Eye Near:    Bilateral Near:     Physical Exam Vitals and nursing note reviewed.  Constitutional:      Appearance: Normal appearance.  HENT:     Head: Atraumatic.     Right Ear: Tympanic membrane and external ear normal.     Left Ear: Tympanic membrane and external ear normal.     Nose: Rhinorrhea present.     Mouth/Throat:     Mouth: Mucous membranes are moist.     Pharynx: Posterior oropharyngeal erythema present.  Eyes:     Extraocular Movements: Extraocular movements intact.     Conjunctiva/sclera: Conjunctivae normal.  Cardiovascular:     Rate and Rhythm: Normal  rate and regular rhythm.     Heart sounds: Normal heart sounds.  Pulmonary:     Effort: Pulmonary effort is normal.     Breath sounds: Wheezing present. No rales.  Musculoskeletal:        General: Normal range of motion.     Cervical back: Normal range of motion and neck supple.  Skin:    General: Skin is warm and dry.  Neurological:     Mental Status: She is alert and oriented to person, place, and time.  Psychiatric:        Mood and Affect: Mood normal.        Thought Content: Thought content normal.      UC Treatments / Results  Labs (all labs ordered are listed, but only abnormal results are displayed) Labs Reviewed  SARS CORONAVIRUS 2 (TAT 6-24 HRS)  POCT INFLUENZA A/B    EKG   Radiology No results found.  Procedures Procedures (including critical care time)  Medications Ordered in UC Medications - No data to display  Initial Impression / Assessment and Plan / UC Course  I have reviewed the triage vital signs and the nursing notes.  Pertinent labs & imaging results that were available during my care of the patient were reviewed by me and considered in my medical decision making (see chart for details).     Mildly  tachycardic in triage, otherwise vital signs reassuring.  She is overall well-appearing and in no acute distress.  Suspect viral respiratory infection causing a COPD exacerbation.  Rapid flu negative, COVID testing pending.  Good candidate for Paxlovid if positive.  Treat with prednisone, Mucinex, Phenergan DM, DuoNeb treatments supportive home care.  Return for worsening symptoms.  Final Clinical Impressions(s) / UC Diagnoses   Final diagnoses:  Viral URI with cough  COPD exacerbation [facility])   Discharge Instructions   None    ED Prescriptions     Medication Sig Dispense Auth. Provider   predniSONE (DELTASONE) 20 MG tablet Take 2 tablets (40 mg total) by mouth daily with breakfast. 10 tablet Volney American, PA-C    ipratropium-albuterol (DUONEB) 0.5-2.5 (3) MG/3ML SOLN Take 3 mLs by nebulization every 4 (four) hours as needed. 360 mL Volney American, PA-C   guaiFENesin (MUCINEX) 600 MG 12 hr tablet Take 1 tablet (600 mg total) by mouth 2 (two) times daily. 20 tablet Volney American, [place]   promethazine-dextromethorphan (PROMETHAZINE-DM) 6.25-15 MG/5ML syrup Take 5 mLs by mouth 4 (four) times daily as needed. 100 mL Volney American, [place]      PDMP not reviewed this encounter.   Volney American, [place] 03/21/22 1445

## 2022-03-21 NOTE — ED Triage Notes (Signed)
Pt reports nasal congestion, fever 101.0 F, cough, sneezing x 5 days. Taking Tylenol.

## 2022-03-22 LAB — SARS CORONAVIRUS 2 (TAT 6-24 HRS): SARS Coronavirus 2: NEGATIVE

## 2022-07-29 ENCOUNTER — Other Ambulatory Visit (HOSPITAL_COMMUNITY): Payer: Self-pay | Admitting: Internal Medicine

## 2022-07-29 DIAGNOSIS — Z1231 Encounter for screening mammogram for malignant neoplasm of breast: Secondary | ICD-10-CM

## 2022-07-31 ENCOUNTER — Other Ambulatory Visit (HOSPITAL_COMMUNITY): Payer: Self-pay | Admitting: Internal Medicine

## 2022-07-31 DIAGNOSIS — F17200 Nicotine dependence, unspecified, uncomplicated: Secondary | ICD-10-CM

## 2022-08-06 ENCOUNTER — Ambulatory Visit (HOSPITAL_COMMUNITY)
Admission: RE | Admit: 2022-08-06 | Discharge: 2022-08-06 | Disposition: A | Discharge status: Home / Self Care | Payer: Medicaid Other | Source: Ambulatory Visit | Attending: Internal Medicine | Admitting: Internal Medicine

## 2022-08-06 ENCOUNTER — Encounter (HOSPITAL_COMMUNITY): Payer: Self-pay

## 2022-08-06 DIAGNOSIS — Z1231 Encounter for screening mammogram for malignant neoplasm of breast: Secondary | ICD-10-CM | POA: Insufficient documentation

## 2022-09-16 ENCOUNTER — Ambulatory Visit (HOSPITAL_COMMUNITY): Admission: RE | Admit: 2022-09-16 | Payer: Medicaid Other | Source: Ambulatory Visit

## 2022-09-22 ENCOUNTER — Ambulatory Visit
Admission: EM | Admit: 2022-09-22 | Discharge: 2022-09-22 | Disposition: A | Discharge status: Home / Self Care | Payer: Medicaid Other | Attending: Family Medicine | Admitting: Family Medicine

## 2022-09-22 DIAGNOSIS — K1120 Sialoadenitis, unspecified: Secondary | ICD-10-CM

## 2022-09-22 MED ORDER — CLINDAMYCIN HCL 300 MG PO CAPS: 300.0000 mg | ORAL_CAPSULE | Freq: Two times a day (BID) | ORAL | 0 refills | Status: DC

## 2022-09-22 NOTE — ED Triage Notes (Signed)
Pt states she has been having swelling and pain on right side of throat that started 3 days ago. No pain while swallowing just pain on the outside of her neck.

## 2022-09-22 NOTE — ED Provider Notes (Signed)
[facility]    CSN: [id number] Arrival date & time: 09/22/22  1209      History   Chief Complaint Chief Complaint  Patient presents with   Sore Throat    HPI [name] is a 57 y.o. female.   Patient presenting today with 3-day history of new onset swelling, pain to the right side of neck just below the jawline.  States he gets significantly worse after she eats or drinks anything and then comes back down a bit after a while.  Denies fever, sore throat, dental issues on this side (has full set of dentures top and bottom), headache, difficulty breathing or swallowing.  So far not trying anything over-the-counter for symptoms.  Denies any past history of similar issues.    Past Medical History:  Diagnosis Date   Asthma    Bipolar disease, chronic (HCC)    Cancer (HCC)    cervical cancer. partial hysterectomy in 20s.    Cervical cancer (HCC)    COPD (chronic obstructive pulmonary disease) (HCC)    DDD (degenerative disc disease)    Diabetes mellitus    not on medication   Factor 5 Leiden mutation, heterozygous (HCC)    GERD (gastroesophageal reflux disease)    Hep C w/o coma, chronic (HCC)    genotype 1 A without cirrhosis s/p treatment with Harvoni completed Jan 2018, HCV RNA not detected March 2018.   Hypertension    MRSA (methicillin resistant Staphylococcus aureus) carrier    Pneumonia    Seizures [facility])     Patient Active Problem List   Diagnosis Date Noted   History of hepatitis C 08/29/2020   Rectal bleeding 08/29/2020   Screening mammogram for breast cancer 05/22/2020   Smoker 05/22/2020   Encounter for screening fecal occult blood testing 05/22/2020   Encounter for gynecological examination with Papanicolaou smear of cervix 05/22/2020   S/P hysterectomy 05/22/2020   Screening for colorectal cancer 05/20/2017   Well woman exam with routine gynecological exam 05/20/2017   Vaginal Pap smear following hysterectomy for malignancy 05/20/2017    Constipation 03/27/2016   Right wrist pain 02/26/2016   Factor V Leiden (HCC) 10/04/2013    Past Surgical History:  Procedure Laterality Date   ABDOMINAL HYSTERECTOMY     cervical cancer   BACK SURGERY     carpel tunnel     COLONOSCOPY  20 yrs   [place]   COLONOSCOPY WITH PROPOFOL N/A 10/22/2020   Procedure: COLONOSCOPY WITH PROPOFOL;  Surgeon: Lanelle Bal, DO;  [facility];  Service: Endoscopy;  Laterality: N/A;  9:00am   MASS EXCISION     POLYPECTOMY  10/22/2020   Procedure: POLYPECTOMY;  Surgeon: Lanelle Bal, DO;  [facility];  Service: Endoscopy;;    OB History     Gravida  1   Para  1   Term  1   Preterm      AB      Living  1      SAB      IAB      Ectopic      Multiple      Live Births  1            Home Medications    Prior to Admission medications   Medication Sig Start Date End Date Taking? Authorizing Provider  acetaminophen (TYLENOL) 325 MG tablet Take 650 mg by mouth every 6 (six) hours as needed for moderate pain.  Yes [provider]  albuterol (PROVENTIL HFA;VENTOLIN HFA) 108 (90 BASE) MCG/ACT inhaler Inhale 2 puffs into the lungs every 6 (six) hours as needed. For shortness of breath   Yes [provider]  clindamycin (CLEOCIN) 300 MG capsule Take 1 capsule (300 mg total) by mouth 2 (two) times daily. 09/22/22  Yes Particia Nearing, PA-C  esomeprazole (NEXIUM) 20 MG capsule Take 20 mg by mouth daily. 09/12/20  Yes [provider]  Fluticasone-Umeclidin-Vilant (TRELEGY ELLIPTA) 100-62.5-25 MCG/INH AEPB Inhale 1 puff into the lungs daily.   Yes [provider]  gabapentin (NEURONTIN) 300 MG capsule Take 600-1,200 mg by mouth See admin instructions. 600 mg in the morning, and 1200 mg at bedtime   Yes [provider]  guaiFENesin (MUCINEX) 600 MG 12 hr tablet Take 1 tablet (600 mg total) by mouth 2 (two) times daily. 03/21/22  Yes Particia Nearing,  PA-C  hydrochlorothiazide (HYDRODIURIL) 25 MG tablet Take 25 mg by mouth daily.   Yes [provider]  loratadine (CLARITIN) 10 MG tablet Take 10 mg by mouth daily.   Yes [provider]  metFORMIN (GLUCOPHAGE) 500 MG tablet Take 300 mg by mouth 2 (two) times daily with a meal.   Yes [provider]  Potassium Chloride ER 20 MEQ TBCR Take 20 mEq daily by mouth.    Yes [provider]  albuterol (PROVENTIL) (2.5 MG/3ML) 0.083% nebulizer solution Take 2.5 mg by nebulization 2 (two) times daily as needed for wheezing or shortness of breath.    [provider]  erythromycin ophthalmic ointment Place a 1/2 inch ribbon of ointment into the left lower eyelid BID prn. 01/23/22   Particia Nearing, PA-C  ipratropium-albuterol (DUONEB) 0.5-2.5 (3) MG/3ML SOLN Take 3 mLs by nebulization every 4 (four) hours as needed. 03/21/22   Particia Nearing, PA-C  levalbuterol (XOPENEX) 1.25 MG/3ML nebulizer solution Take 1.25 mg by nebulization every 4 (four) hours as needed for wheezing or shortness of breath. Patient not taking: Reported on 10/18/2020    [provider]  mupirocin ointment (BACTROBAN) 2 % Apply 1 application topically 2 (two) times daily as needed (sores).    [provider]  polyethylene glycol-electrolytes (NULYTELY) 420 g solution As directed 08/29/20   Lanelle Bal, DO  predniSONE (DELTASONE) 20 MG tablet Take 2 tablets (40 mg total) by mouth daily with breakfast. 03/21/22   Particia Nearing, PA-C  promethazine-dextromethorphan (PROMETHAZINE-DM) 6.25-15 MG/5ML syrup Take 5 mLs by mouth 4 (four) times daily as needed. 03/21/22   Particia Nearing, PA-C    Family History Family History  Problem Relation Age of Onset   Diabetes Mother    Cancer Mother        breast    Hypertension Mother    Diverticulitis Mother        required surgery   Other Father        brain tumor, fluid around heart   Factor V Leiden  deficiency Father    Drug abuse Sister    Other Brother        MVA   Cancer Maternal Grandmother    Diabetes Maternal Grandfather    Cancer Paternal Grandfather    Cancer Other    Colon cancer Neg Hx    Liver disease Neg Hx     Social History Social History   Tobacco Use   Smoking status: Every Day    Current packs/day: 1.00    Average packs/day: 1 pack/day  for 40.0 years (40.0 ttl pk-yrs)    Types: Cigarettes   Smokeless tobacco: Never  Vaping Use   Vaping status: Never Used  Substance Use Topics   Alcohol use: No   Drug use: Yes    Types: Marijuana    Comment: twice a week     Allergies   Cephalexin, Celecoxib, Imipramine hcl, Other, Abilify [aripiprazole], Augmentin [amoxicillin-pot clavulanate], Keflex [cephalexin], Penicillins, Seroquel [quetiapine fumarate], Aspirin, and Sulfa antibiotics   Review of Systems Review of Systems Per HPI  Physical Exam Triage Vital Signs ED Triage Vitals  Encounter Vitals Group     BP 09/22/22 1404 (!) 141/86     Systolic BP Percentile --      Diastolic BP Percentile --      Pulse Rate 09/22/22 1404 88     Resp 09/22/22 1404 16     Temp 09/22/22 1404 98.2 F (36.8 C)     Temp Source 09/22/22 1404 Oral     SpO2 09/22/22 1404 96 %     Weight --      Height --      Head Circumference --      Peak Flow --      Pain Score 09/22/22 1415 0     Pain Loc --      Pain Education --      Exclude from Growth Chart --    No data found.  Updated Vital Signs BP (!) 141/86 (BP Location: Right Arm)   Pulse 88   Temp 98.2 F (36.8 C) (Oral)   Resp 16   SpO2 96%   Visual Acuity Right Eye Distance:   Left Eye Distance:   Bilateral Distance:    Right Eye Near:   Left Eye Near:    Bilateral Near:     Physical Exam Vitals and nursing note reviewed.  Constitutional:      Appearance: Normal appearance.  HENT:     Head: Atraumatic.     Right Ear: Tympanic membrane and external ear normal.     Left Ear: Tympanic membrane  and external ear normal.     Mouth/Throat:     Mouth: Mucous membranes are moist.     Pharynx: No oropharyngeal exudate or posterior oropharyngeal erythema.  Eyes:     Extraocular Movements: Extraocular movements intact.     Conjunctiva/sclera: Conjunctivae normal.  Neck:     Comments: Firm palpable mass just below the right jawline, mildly tender to palpation.  No erythema, fluctuance, induration Cardiovascular:     Rate and Rhythm: Normal rate and regular rhythm.     Heart sounds: Normal heart sounds.  Pulmonary:     Effort: Pulmonary effort is normal.     Breath sounds: Normal breath sounds. No wheezing or rales.  Musculoskeletal:        General: Normal range of motion.     Cervical back: Normal range of motion.  Skin:    General: Skin is warm and dry.  Neurological:     Mental Status: She is alert and oriented to person, place, and time.  Psychiatric:        Mood and Affect: Mood normal.        Thought Content: Thought content normal.      UC Treatments / Results  Labs (all labs ordered are listed, but only abnormal results are displayed) Labs Reviewed - No data to display  EKG   Radiology No results found.  Procedures Procedures (including critical care time)  Medications  Ordered in UC Medications - No data to display  Initial Impression / Assessment and Plan / UC Course  I have reviewed the triage vital signs and the nursing notes.  Pertinent labs & imaging results that were available during my care of the patient were reviewed by me and considered in my medical decision making (see chart for details).     Suspect salivary infection, particularly given the pattern of worsening with eating or drinking anything.  Treat with warm compresses, course of clindamycin, over-the-counter pain relievers.  Follow-up with PCP for recheck, return sooner for worsening symptoms.  Final Clinical Impressions(s) / UC Diagnoses   Final diagnoses:  Salivary gland infection      Discharge Instructions      May apply warm compresses, stay well hydrated, take full course of antibiotics. If worsening or not resolving follow up for further evaluation    ED Prescriptions     Medication Sig Dispense Auth. Provider   clindamycin (CLEOCIN) 300 MG capsule Take 1 capsule (300 mg total) by mouth 2 (two) times daily. 14 capsule Particia Nearing, [place]      PDMP not reviewed this encounter.   Particia Nearing, [place] 09/22/22 1617

## 2022-09-22 NOTE — Discharge Instructions (Addendum)
May apply warm compresses, stay well hydrated, take full course of antibiotics. If worsening or not resolving follow up for further evaluation

## 2022-10-20 ENCOUNTER — Ambulatory Visit (HOSPITAL_COMMUNITY)
Admission: RE | Admit: 2022-10-20 | Discharge: 2022-10-20 | Disposition: A | Discharge status: Home / Self Care | Payer: Medicaid Other | Source: Ambulatory Visit | Attending: Internal Medicine | Admitting: Internal Medicine

## 2022-10-20 DIAGNOSIS — F17200 Nicotine dependence, unspecified, uncomplicated: Secondary | ICD-10-CM | POA: Insufficient documentation

## 2022-11-13 ENCOUNTER — Other Ambulatory Visit (HOSPITAL_COMMUNITY): Payer: Self-pay | Admitting: Gerontology

## 2022-11-13 DIAGNOSIS — R918 Other nonspecific abnormal finding of lung field: Secondary | ICD-10-CM

## 2023-01-25 ENCOUNTER — Encounter: Payer: Self-pay | Admitting: Emergency Medicine

## 2023-01-25 ENCOUNTER — Ambulatory Visit
Admission: EM | Admit: 2023-01-25 | Discharge: 2023-01-25 | Disposition: A | Discharge status: Home / Self Care | Payer: Medicaid Other

## 2023-01-25 DIAGNOSIS — R059 Cough, unspecified: Secondary | ICD-10-CM

## 2023-01-25 DIAGNOSIS — B349 Viral infection, unspecified: Secondary | ICD-10-CM | POA: Diagnosis not present

## 2023-01-25 LAB — POC COVID19/FLU A&B COMBO
Covid Antigen, POC: NEGATIVE
Influenza A Antigen, POC: NEGATIVE
Influenza B Antigen, POC: NEGATIVE

## 2023-01-25 MED ORDER — PROMETHAZINE-DM 6.25-15 MG/5ML PO SYRP: 5.0000 mL | ORAL_SOLUTION | Freq: Four times a day (QID) | ORAL | 0 refills | Status: DC | PRN

## 2023-01-25 MED ORDER — LOPERAMIDE HCL 2 MG PO CAPS: 2.0000 mg | ORAL_CAPSULE | Freq: Four times a day (QID) | ORAL | 0 refills | Status: DC | PRN

## 2023-01-25 NOTE — Discharge Instructions (Addendum)
The COVID/flu test was negative.  Still believe this is a viral illness.  Symptoms should improve over the next 3 to 5 days. Take medication as prescribed. Increase fluids and allow for plenty of rest. Recommend use of Pedialyte orGatorlyte to prevent dehydration. May take over-the-counter ibuprofen 600 mg (3 tablets) every 8 hours as needed for fever, pain, or generalized discomfort.  Make sure you take the medication with food and water. May use normal saline nasal spray throughout the day for nasal congestion and runny nose. For your cough, recommend using a humidifier in your bedroom at nighttime during sleep and sleeping elevated on pillows while cough symptoms persist. You should remain home until you have been fever free for 24 hours with no medication. If you experience worsening to include worsening fever, concern for dehydration, or cough, please follow-up in the emergency department for further evaluation. Follow-up as needed.

## 2023-01-25 NOTE — ED Triage Notes (Signed)
Pt reports congestion, productive cough w/ green sputum, body aches, chills, and fevers x3 days. Started having episodes of diarrhea at 1am last night, ~10 episodes. Denies N/V. Also reports intermittent dizziness with the worst coughing spells. Decreased appetite x2 days, but trying to drink more fluids. Fever relief with tylenol cold/flu. Has taken dose this morning.

## 2023-01-25 NOTE — ED Provider Notes (Signed)
[facility]    CSN: [id number] Arrival date & time: 01/25/23  4010      History   Chief Complaint Chief Complaint  Patient presents with   Generalized Body Aches   Fever    HPI [name] is a 58 y.o. female.   The history is provided by the patient.   Patient presents for complaints of fever, chills, body aches, headache, nasal congestion, runny nose, cough, and diarrhea.  Patient reports she has had approximately 8-10 episodes of diarrhea since the symptoms started around 1 AM this morning.  She also complains of dizziness when she has a coughing "spell."  Patient denies ear drainage, wheezing, difficulty breathing, abdominal pain, nausea, or vomiting.  She reports that she has been taking over-the-counter cold and flu medication on 2 occasions, with minimal relief.  Denies any obvious known sick contacts.  Past Medical History:  Diagnosis Date   Asthma    Bipolar disease, chronic (HCC)    Cancer (HCC)    cervical cancer. partial hysterectomy in 20s.    Cervical cancer (HCC)    COPD (chronic obstructive pulmonary disease) (HCC)    DDD (degenerative disc disease)    Diabetes mellitus    not on medication   Factor 5 Leiden mutation, heterozygous (HCC)    GERD (gastroesophageal reflux disease)    Hep C w/o coma, chronic (HCC)    genotype 1 A without cirrhosis s/p treatment with Harvoni completed Jan 2018, HCV RNA not detected March 2018.   Hypertension    MRSA (methicillin resistant Staphylococcus aureus) carrier    Pneumonia    Seizures [facility])     Patient Active Problem List   Diagnosis Date Noted   History of hepatitis C 08/29/2020   Rectal bleeding 08/29/2020   Screening mammogram for breast cancer 05/22/2020   Smoker 05/22/2020   Encounter for screening fecal occult blood testing 05/22/2020   Encounter for gynecological examination with Papanicolaou smear of cervix 05/22/2020   S/P hysterectomy 05/22/2020   Screening for colorectal cancer  05/20/2017   Well woman exam with routine gynecological exam 05/20/2017   Vaginal Pap smear following hysterectomy for malignancy 05/20/2017   Constipation 03/27/2016   Right wrist pain 02/26/2016   Factor V Leiden (HCC) 10/04/2013    Past Surgical History:  Procedure Laterality Date   ABDOMINAL HYSTERECTOMY     cervical cancer   BACK SURGERY     carpel tunnel     COLONOSCOPY  20 yrs   [place]   COLONOSCOPY WITH PROPOFOL N/A 10/22/2020   Procedure: COLONOSCOPY WITH PROPOFOL;  Surgeon: Lanelle Bal, DO;  Location: [facility];  Service: Endoscopy;  Laterality: N/A;  9:00am   MASS EXCISION     POLYPECTOMY  10/22/2020   Procedure: POLYPECTOMY;  Surgeon: Lanelle Bal, DO;  Location: [facility];  Service: Endoscopy;;    OB History     Gravida  1   Para  1   Term  1   Preterm      AB      Living  1      SAB      IAB      Ectopic      Multiple      Live Births  1            Home Medications    Prior to Admission medications   Medication Sig Start Date End Date Taking? Authorizing Provider  cyclobenzaprine (FLEXERIL) 10 MG  tablet Take by mouth. Patient not taking: Reported on 01/25/2023 04/27/16   [provider]  Docusate Sodium (DSS) 100 MG CAPS Take by mouth. 02/26/16   [provider]  doxycycline (VIBRAMYCIN) 100 MG capsule Take by mouth. Patient not taking: Reported on 01/25/2023 05/05/16   [provider]  fluticasone-salmeterol (ADVAIR DISKUS) 250-50 MCG/ACT AEPB Inhale into the lungs. Patient not taking: Reported on 01/25/2023 02/26/16   [provider]  gabapentin (NEURONTIN) 600 MG tablet Take by mouth. 02/26/16  Yes [provider]  lubiprostone (AMITIZA) 24 MCG capsule Take by mouth. Patient not taking: Reported on 01/25/2023 03/27/16   [provider]  omeprazole (PRILOSEC) 20 MG capsule Take by mouth. 10/09/15  Yes [provider]  potassium chloride (KLOR-CON) 10 MEQ  tablet Take by mouth. 02/26/16  Yes [provider]  umeclidinium bromide (INCRUSE ELLIPTA) 62.5 MCG/ACT AEPB Inhale into the lungs. 02/26/16   [provider]  acetaminophen (TYLENOL) 325 MG tablet Take 650 mg by mouth every 6 (six) hours as needed for moderate pain.   Yes [provider]  albuterol (PROVENTIL HFA;VENTOLIN HFA) 108 (90 BASE) MCG/ACT inhaler Inhale 2 puffs into the lungs every 6 (six) hours as needed. For shortness of breath    [provider]  albuterol (PROVENTIL) (2.5 MG/3ML) 0.083% nebulizer solution Take 2.5 mg by nebulization 2 (two) times daily as needed for wheezing or shortness of breath.    [provider]  clindamycin (CLEOCIN) 300 MG capsule Take 1 capsule (300 mg total) by mouth 2 (two) times daily. Patient not taking: Reported on 01/25/2023 09/22/22   Particia Nearing, PA-C  erythromycin ophthalmic ointment Place a 1/2 inch ribbon of ointment into the left lower eyelid BID prn. Patient not taking: Reported on 01/25/2023 01/23/22   Particia Nearing, PA-C  esomeprazole (NEXIUM) 20 MG capsule Take 20 mg by mouth daily. Patient not taking: Reported on 01/25/2023 09/12/20   [provider]  Fluticasone-Umeclidin-Vilant (TRELEGY ELLIPTA) 100-62.5-25 MCG/INH AEPB Inhale 1 puff into the lungs daily.   Yes [provider]  gabapentin (NEURONTIN) 300 MG capsule Take 600-1,200 mg by mouth See admin instructions. 600 mg in the morning, and 1200 mg at bedtime   Yes [provider]  guaiFENesin (MUCINEX) 600 MG 12 hr tablet Take 1 tablet (600 mg total) by mouth 2 (two) times daily. Patient not taking: Reported on 01/25/2023 03/21/22   Particia Nearing, PA-C  hydrochlorothiazide (HYDRODIURIL) 25 MG tablet Take 25 mg by mouth daily.   Yes [provider]  ipratropium-albuterol (DUONEB) 0.5-2.5 (3) MG/3ML SOLN Take 3 mLs by nebulization every 4 (four) hours as needed. 03/21/22   Particia Nearing, PA-C  levalbuterol (XOPENEX) 1.25 MG/3ML nebulizer solution Take 1.25 mg by nebulization every 4 (four) hours as needed for wheezing or shortness of breath. Patient not taking: Reported on 10/18/2020    [provider]  loratadine (CLARITIN) 10 MG tablet Take 10 mg by mouth daily.   Yes [provider]  metFORMIN (GLUCOPHAGE) 500 MG tablet Take 300 mg by mouth 2 (two) times daily with a meal.   Yes [provider]  mupirocin ointment (BACTROBAN) 2 % Apply 1 application topically 2 (two) times daily as needed (sores). Patient not taking: Reported on 01/25/2023    [provider]  polyethylene glycol-electrolytes (NULYTELY) 420 g solution As directed Patient not taking: Reported on 01/25/2023 08/29/20   Lanelle Bal, DO  Potassium Chloride ER 20 MEQ TBCR  Take 20 mEq daily by mouth.    Yes [provider]  predniSONE (DELTASONE) 20 MG tablet Take 2 tablets (40 mg total) by mouth daily with breakfast. Patient not taking: Reported on 01/25/2023 03/21/22   Particia Nearing, PA-C  promethazine-dextromethorphan (PROMETHAZINE-DM) 6.25-15 MG/5ML syrup Take 5 mLs by mouth 4 (four) times daily as needed. Patient not taking: Reported on 01/25/2023 03/21/22   Particia Nearing, PA-C    Family History Family History  Problem Relation Age of Onset   Diabetes Mother    Cancer Mother        breast    Hypertension Mother    Diverticulitis Mother        required surgery   Other Father        brain tumor, fluid around heart   Factor V Leiden deficiency Father    Drug abuse Sister    Other Brother        MVA   Cancer Maternal Grandmother    Diabetes Maternal Grandfather    Cancer Paternal Grandfather    Cancer Other    Colon cancer Neg Hx    Liver disease Neg Hx     Social History Social History   Tobacco Use   Smoking status: Every Day    Current packs/day: 1.00    Average packs/day: 1 pack/day for 40.0 years (40.0 ttl pk-yrs)     Types: Cigarettes   Smokeless tobacco: Never  Vaping Use   Vaping status: Never Used  Substance Use Topics   Alcohol use: No   Drug use: Yes    Types: Marijuana    Comment: twice a week     Allergies   Cephalexin, Cephalexin, Amoxicillin-pot clavulanate, Aspirin, Celecoxib, Imipramine hcl, Other, Aripiprazole, Penicillins, Seroquel [quetiapine fumarate], and Sulfa antibiotics   Review of Systems Review of Systems Per HPI  Physical Exam Triage Vital Signs ED Triage Vitals [01/25/23 1004]  Encounter Vitals Group     BP (!) 152/82     Systolic BP Percentile      Diastolic BP Percentile      Pulse Rate (!) 103     Resp 18     Temp 98.5 F (36.9 C)     Temp Source Oral     SpO2 95 %     Weight      Height      Head Circumference      Peak Flow      Pain Score 6     Pain Loc      Pain Education      Exclude from Growth Chart    No data found.  Updated Vital Signs BP (!) 152/82 (BP Location: Right Arm)   Pulse (!) 103   Temp 98.5 F (36.9 C) (Oral)   Resp 18   SpO2 95%   Visual Acuity Right Eye Distance:   Left Eye Distance:   Bilateral Distance:    Right Eye Near:   Left Eye Near:    Bilateral Near:     Physical Exam Vitals and nursing note reviewed.  Constitutional:      General: She is not in acute distress.    Appearance: Normal appearance.  HENT:     Head: Normocephalic.     Right Ear: Tympanic membrane, ear canal and external ear normal.     Left Ear: Tympanic membrane, ear canal and external ear normal.     Nose: Congestion present.     Mouth/Throat:  Mouth: Mucous membranes are moist.  Eyes:     Extraocular Movements: Extraocular movements intact.     Conjunctiva/sclera: Conjunctivae normal.     Pupils: Pupils are equal, round, and reactive to light.  Cardiovascular:     Rate and Rhythm: Regular rhythm.     Pulses: Normal pulses.     Heart sounds: Normal heart sounds.  Pulmonary:     Effort: Pulmonary effort is normal. No  respiratory distress.     Breath sounds: Normal breath sounds. No stridor. No wheezing, rhonchi or rales.  Abdominal:     General: Bowel sounds are normal.     Palpations: Abdomen is soft.     Tenderness: There is no abdominal tenderness.  Musculoskeletal:     Cervical back: Normal range of motion.  Skin:    General: Skin is warm and dry.  Neurological:     General: No focal deficit present.     Mental Status: She is alert and oriented to person, place, and time.  Psychiatric:        Mood and Affect: Mood normal.        Behavior: Behavior normal.      UC Treatments / Results  Labs (all labs ordered are listed, but only abnormal results are displayed) Labs Reviewed  POC COVID19/FLU A&B COMBO    EKG   Radiology No results found.  Procedures Procedures (including critical care time)  Medications Ordered in UC Medications - No data to display  Initial Impression / Assessment and Plan / UC Course  I have reviewed the triage vital signs and the nursing notes.  Pertinent labs & imaging results that were available during my care of the patient were reviewed by me and considered in my medical decision making (see chart for details).  The COVID/flu test was negative.  Do suspect this is still a viral illness.  Will provide symptomatic treatment with Imodium A-D 2 mg tablets, Promethazine DM for the cough, and fluticasone 50 mcg nasal spray for nasal congestion.  Supportive care recommendations were provided and discussed with the patient to include taking over-the-counter ibuprofen for pain or discomfort, warm salt water gargles, normal saline nasal spray, and use of a humidifier in her bedroom at nighttime during sleep.  Also advised patient to begin Pedialyte or Gatorlyte to prevent dehydration.  Discussed indications with the patient regarding follow-up.  Patient advised to go to the emergency department if worsening symptoms.  Patient was in agreement with this plan of care and  verbalizes understanding.  All questions were answered.  Patient stable for discharge.  Final Clinical Impressions(s) / UC Diagnoses   Final diagnoses:  None   Discharge Instructions   None    ED Prescriptions   None    PDMP not reviewed this encounter.   Abran Cantor, NP 01/25/23 1043

## 2023-02-09 ENCOUNTER — Ambulatory Visit (HOSPITAL_COMMUNITY)
Admission: RE | Admit: 2023-02-09 | Discharge: 2023-02-09 | Disposition: A | Discharge status: Home / Self Care | Payer: Medicaid Other | Source: Ambulatory Visit | Attending: Gerontology | Admitting: Gerontology

## 2023-02-09 DIAGNOSIS — R918 Other nonspecific abnormal finding of lung field: Secondary | ICD-10-CM | POA: Diagnosis present

## 2023-03-24 ENCOUNTER — Ambulatory Visit (HOSPITAL_COMMUNITY)
Admission: RE | Admit: 2023-03-24 | Discharge: 2023-03-24 | Disposition: A | Discharge status: Home / Self Care | Source: Ambulatory Visit | Attending: Gerontology | Admitting: Gerontology

## 2023-03-24 ENCOUNTER — Other Ambulatory Visit (HOSPITAL_COMMUNITY): Payer: Self-pay | Admitting: Gerontology

## 2023-03-24 DIAGNOSIS — M549 Dorsalgia, unspecified: Secondary | ICD-10-CM | POA: Diagnosis present

## 2023-03-31 NOTE — Progress Notes (Deleted)
 Name: [name] DOB: 01/29/1965 MRN: [id number]  History of Present Illness: [name] is a 58 y.o. female who presents today as a new patient at [facility]. All available relevant medical records have been reviewed.  ***She is accompanied by ***.  She reports concern of kidney stone(s).  She had a CT on 02/09/2023 for lung cancer screening which showed an incidental finding of a 2-3 mm non-obstructing left kidney stone. Per referral note from her PCP: "Patient stated that she had mild discomfort to the left side of her kidney mostly at bedtime" therefore she was referred to Urology for stone evaluation.   She {Actions; denies-reports:120008} prior history of kidney stones. She {Actions; denies-reports:120008} prior history of kidney stone procedure(s) ***including ***ESWL ***ureteroscopic stone manipulation ***PCNL.  She {Actions; denies-reports:120008} flank pain or abdominal pain. She {Actions; denies-reports:120008} increased urinary urgency, frequency, nocturia, dysuria, gross hematuria, hesitancy, straining to void, or sensations of incomplete emptying.   Medications: Current Outpatient Medications  Medication Sig Dispense Refill   acetaminophen (TYLENOL) 325 MG tablet Take 650 mg by mouth every 6 (six) hours as needed for moderate pain.     albuterol (PROVENTIL HFA;VENTOLIN HFA) 108 (90 BASE) MCG/ACT inhaler Inhale 2 puffs into the lungs every 6 (six) hours as needed. For shortness of breath     albuterol (PROVENTIL) (2.5 MG/3ML) 0.083% nebulizer solution Take 2.5 mg by nebulization 2 (two) times daily as needed for wheezing or shortness of breath.     clindamycin (CLEOCIN) 300 MG capsule Take 1 capsule (300 mg total) by mouth 2 (two) times daily. (Patient not taking: Reported on 01/25/2023) 14 capsule 0   cyclobenzaprine (FLEXERIL) 10 MG tablet Take by mouth. (Patient not taking: Reported on 01/25/2023)     Docusate Sodium (DSS) 100 MG CAPS Take by mouth.      doxycycline (VIBRAMYCIN) 100 MG capsule Take by mouth. (Patient not taking: Reported on 01/25/2023)     erythromycin ophthalmic ointment Place a 1/2 inch ribbon of ointment into the left lower eyelid BID prn. (Patient not taking: Reported on 01/25/2023) 3.5 g 0   esomeprazole (NEXIUM) 20 MG capsule Take 20 mg by mouth daily. (Patient not taking: Reported on 01/25/2023)     fluticasone-salmeterol (ADVAIR DISKUS) 250-50 MCG/ACT AEPB Inhale into the lungs. (Patient not taking: Reported on 01/25/2023)     Fluticasone-Umeclidin-Vilant (TRELEGY ELLIPTA) 100-62.5-25 MCG/INH AEPB Inhale 1 puff into the lungs daily.     gabapentin (NEURONTIN) 300 MG capsule Take 600-1,200 mg by mouth See admin instructions. 600 mg in the morning, and 1200 mg at bedtime     gabapentin (NEURONTIN) 600 MG tablet Take by mouth.     guaiFENesin (MUCINEX) 600 MG 12 hr tablet Take 1 tablet (600 mg total) by mouth 2 (two) times daily. (Patient not taking: Reported on 01/25/2023) 20 tablet 0   hydrochlorothiazide (HYDRODIURIL) 25 MG tablet Take 25 mg by mouth daily.     ipratropium-albuterol (DUONEB) 0.5-2.5 (3) MG/3ML SOLN Take 3 mLs by nebulization every 4 (four) hours as needed. 360 mL 0   levalbuterol (XOPENEX) 1.25 MG/3ML nebulizer solution Take 1.25 mg by nebulization every 4 (four) hours as needed for wheezing or shortness of breath. (Patient not taking: Reported on 10/18/2020)     loperamide (IMODIUM) 2 MG capsule Take 1 capsule (2 mg total) by mouth 4 (four) times daily as needed for diarrhea or loose stools. Do not take more than 8 tablets in a 24-hour period. 12 capsule 0  loratadine (CLARITIN) 10 MG tablet Take 10 mg by mouth daily.     lubiprostone (AMITIZA) 24 MCG capsule Take by mouth. (Patient not taking: Reported on 01/25/2023)     metFORMIN (GLUCOPHAGE) 500 MG tablet Take 300 mg by mouth 2 (two) times daily with a meal.     mupirocin ointment (BACTROBAN) 2 % Apply 1 application topically 2 (two) times daily as needed  (sores). (Patient not taking: Reported on 01/25/2023)     omeprazole (PRILOSEC) 20 MG capsule Take by mouth.     polyethylene glycol-electrolytes (NULYTELY) 420 g solution As directed (Patient not taking: Reported on 01/25/2023) 4000 mL 0   potassium chloride (KLOR-CON) 10 MEQ tablet Take by mouth.     Potassium Chloride ER 20 MEQ TBCR Take 20 mEq daily by mouth.      predniSONE (DELTASONE) 20 MG tablet Take 2 tablets (40 mg total) by mouth daily with breakfast. (Patient not taking: Reported on 01/25/2023) 10 tablet 0   promethazine-dextromethorphan (PROMETHAZINE-DM) 6.25-15 MG/5ML syrup Take 5 mLs by mouth 4 (four) times daily as needed. 118 mL 0   umeclidinium bromide (INCRUSE ELLIPTA) 62.5 MCG/ACT AEPB Inhale into the lungs.     No current facility-administered medications for this visit.    Allergies: Allergies  Allergen Reactions   Cephalexin Shortness Of Breath   Cephalexin Nausea And Vomiting, Shortness Of Breath and Swelling   Amoxicillin-Pot Clavulanate Nausea And Vomiting   Aspirin Rash, Swelling and Nausea And Vomiting   Celecoxib Nausea And Vomiting   Imipramine Hcl Other (See Comments)    Facial twisting   Other Nausea And Vomiting    Muscle Relaxants   Aripiprazole Nausea And Vomiting   Penicillins Nausea And Vomiting    Has patient had a PCN reaction causing immediate rash, facial/tongue/throat swelling, SOB or lightheadedness with hypotension: Yes  Has patient had a PCN reaction causing severe rash involving mucus membranes or skin necrosis: Yes  Has patient had a PCN reaction that required hospitalization No  Has patient had a PCN reaction occurring within the last 10 years: No  If all of the above answers are "NO", then may proceed with Cephalosporin use.  Has patient had a PCN reaction causing immediate rash, facial/tongue/throat swelling, SOB or lightheadedness with hypotension: Yes, Has patient had a PCN reaction causing severe rash involving mucus membranes or  skin necrosis: Yes, Has patient had a PCN reaction that required hospitalization No, Has patient had a PCN reaction occurring within the last 10 years: No, If all of the above answers are "NO", then may proceed with Cephalosporin use.   Seroquel [Quetiapine Fumarate]     Stroke symptoms    Sulfa Antibiotics Rash and Other (See Comments)    Triggers headaches also    Past Medical History:  Diagnosis Date   Asthma    Bipolar disease, chronic (HCC)    Cancer (HCC)    cervical cancer. partial hysterectomy in 20s.    Cervical cancer (HCC)    COPD (chronic obstructive pulmonary disease) (HCC)    DDD (degenerative disc disease)    Diabetes mellitus    not on medication   Factor 5 Leiden mutation, heterozygous (HCC)    GERD (gastroesophageal reflux disease)    Hep C w/o coma, chronic (HCC)    genotype 1 A without cirrhosis s/p treatment with Harvoni completed Jan 2018, HCV RNA not detected March 2018.   Hypertension    MRSA (methicillin resistant Staphylococcus aureus) carrier    Pneumonia  Seizures (HCC)    Past Surgical History:  Procedure Laterality Date   ABDOMINAL HYSTERECTOMY     cervical cancer   BACK SURGERY     carpel tunnel     COLONOSCOPY  20 yrs   [place]   COLONOSCOPY WITH PROPOFOL N/A 10/22/2020   Procedure: COLONOSCOPY WITH PROPOFOL;  Surgeon: Lanelle Bal, DO;  Location: [facility];  Service: Endoscopy;  Laterality: N/A;  9:00am   MASS EXCISION     POLYPECTOMY  10/22/2020   Procedure: POLYPECTOMY;  Surgeon: Lanelle Bal, DO;  Location: [facility];  Service: Endoscopy;;   Family History  Problem Relation Age of Onset   Diabetes Mother    Cancer Mother        breast    Hypertension Mother    Diverticulitis Mother        required surgery   Other Father        brain tumor, fluid around heart   Factor V Leiden deficiency Father    Drug abuse Sister    Other Brother        MVA   Cancer Maternal Grandmother    Diabetes Maternal Grandfather     Cancer Paternal Grandfather    Cancer Other    Colon cancer Neg Hx    Liver disease Neg Hx    Social History   Socioeconomic History   Marital status: Single    Spouse name: Not on file   Number of children: Not on file   Years of education: Not on file   Highest education level: Not on file  Occupational History   Not on file  Tobacco Use   Smoking status: Every Day    Current packs/day: 1.00    Average packs/day: 1 pack/day for 40.0 years (40.0 ttl pk-yrs)    Types: Cigarettes   Smokeless tobacco: Never  Vaping Use   Vaping status: Never Used  Substance and Sexual Activity   Alcohol use: No   Drug use: Yes    Types: Marijuana    Comment: twice a week   Sexual activity: Not Currently    Birth control/protection: Surgical    Comment: hyst  Other Topics Concern   Not on file  Social History Narrative   Not on file   Social Drivers of Health   Financial Resource Strain: Low Risk  (05/22/2020)   Overall Financial Resource Strain (CARDIA)    Difficulty of Paying Living Expenses: Not very hard  Food Insecurity: No Food Insecurity (05/22/2020)   Hunger Vital Sign    Worried About Running Out of Food in the Last Year: Never true    Ran Out of Food in the Last Year: Never true  Transportation Needs: No Transportation Needs (05/22/2020)   PRAPARE - Administrator, Civil Service (Medical): No    Lack of Transportation (Non-Medical): No  Physical Activity: Sufficiently Active (05/22/2020)   Exercise Vital Sign    Days of Exercise per Week: 7 days    Minutes of Exercise per Session: 100 min  Stress: No Stress Concern Present (05/22/2020)   [employer] of Occupational Health - Occupational Stress Questionnaire    Feeling of Stress : Not at all  Social Connections: Moderately Isolated (05/22/2020)   Social Connection and Isolation Panel [NHANES]    Frequency of Communication with Friends and Family: More than three times a week    Frequency of Social  Gatherings with Friends and Family: Twice a week  Attends Religious Services: 1 to 4 times per year    Active Member of Clubs or Organizations: No    Attends Banker Meetings: Never    Marital Status: Never married  Intimate Partner Violence: Not At Risk (05/22/2020)   Humiliation, Afraid, Rape, and Kick questionnaire    Fear of Current or Ex-Partner: No    Emotionally Abused: No    Physically Abused: No    Sexually Abused: No    SUBJECTIVE  Review of Systems Constitutional: Patient denies any unintentional weight loss or change in strength lntegumentary: Patient denies any rashes or pruritus Cardiovascular: Patient denies chest pain or syncope Respiratory: Patient denies shortness of breath Gastrointestinal: ***Patient {Actions; denies-reports:120008} ***nausea, ***vomiting, ***constipation, ***diarrhea ***As per HPI Musculoskeletal: Patient denies muscle cramps or weakness Neurologic: Patient denies convulsions or seizures Allergic/Immunologic: Patient denies recent allergic reaction(s) Hematologic/Lymphatic: Patient denies bleeding tendencies Endocrine: Patient denies heat/cold intolerance  GU: As per HPI.  OBJECTIVE There were no vitals filed for this visit. There is no height or weight on file to calculate BMI.  Physical Examination Constitutional: No obvious distress; patient is non-toxic appearing  Cardiovascular: No visible lower extremity edema.  Respiratory: The patient does not have audible wheezing/stridor; respirations do not appear labored  Gastrointestinal: Abdomen non-distended Musculoskeletal: Normal ROM of UEs  Skin: No obvious rashes/open sores  Neurologic: CN 2-12 grossly intact Psychiatric: Answered questions appropriately with normal affect  Hematologic/Lymphatic/Immunologic: No obvious bruises or sites of spontaneous bleeding  UA:  ***positive for *** leukocytes, *** blood, ***nitrites ***Urine microscopy:  ***negative  ***  WBC/hpf, *** RBC/hpf, *** bacteria ***with no evidence of UTI ***with no evidence of microscopic hematuria ***otherwise unremarkable ***glucosuria (secondary to ***Jardiance ***Farxiga use)  PVR: *** ml  ASSESSMENT No diagnosis found.  ***We reviewed recent imaging results; ***awaiting radiology results, appears to have ***no acute findings per provider interpretation.  ***Advised adequate hydration and we discussed option to consider low oxalate diet given that calcium oxalate is the most common type of stone. Handout provided about stone prevention diet.  For acute GU stone symptoms we agreed to proceed with: - ***RUS and KUB / ***CT stone study to evaluate current stone burden.  - ***CMP to assess kidney function. - ***Flomax 0.4 mg daily for medical expulsive therapy (MET), which may improve passage of stone(s).  - For pain management, we discussed the use of opioids versus OTC analgesics. A 5 day prescription was sent for ***Percocet for PRN use for severe pain.  - For nausea / vomiting, a prescription was sent for ***Zofran / ***Phenergan for PRN use.  ***We discussed the various treatment options including ***medical expulsive therapy (MET), ***extracorporeal shock wave lithotripsy (ESWL), ureteroscopic stone manipulation (URS), or ***percutaneous nephrolithotomy (PCNL). We discussed possible risks and benefits of intervention including but not limited to: including pain, infection, sepsis, UTI, ureter perforation, need for stenting, post-op ureteral stricture, hematuria.  ***Will consult with ***Dr. Ronne Binning and notify patient of his recommendations ***for next steps / ***following review of imaging results.  Will plan to follow up in ***2 weeks ***6 months with ***KUB ***RUS for stone surveillance or sooner if needed.   She was advised to contact urology provider or go to the ER if She develops fever >101F, uncontrollable pain, or other significantly concerning symptoms prior to  next office visit.  She verbalized understanding and agreement. All questions were answered.   PLAN Advised the following: ***Flomax daily x2 weeks. ***Analgesics PRN for pain. ***Zofran PRN for nausea. ***No follow-ups on  file.  No orders of the defined types were placed in this encounter.   It has been explained that the patient is to follow regularly with their PCP in addition to all other providers involved in their care and to follow instructions provided by these respective offices. Patient advised to contact urology clinic if any urologic-pertaining questions, concerns, new symptoms or problems arise in the interim period.  There are no Patient Instructions on file for this visit.  Electronically signed by: Donnita Falls, MSN, FNP-C, CUNP 03/31/2023 12:57 PM

## 2023-04-01 ENCOUNTER — Ambulatory Visit: Payer: Medicaid Other | Admitting: Urology

## 2023-04-01 DIAGNOSIS — N200 Calculus of kidney: Secondary | ICD-10-CM

## 2023-05-15 ENCOUNTER — Ambulatory Visit
Admission: RE | Admit: 2023-05-15 | Discharge: 2023-05-15 | Disposition: A | Discharge status: Home / Self Care | Source: Ambulatory Visit | Attending: Nurse Practitioner

## 2023-05-15 VITALS — BP 109/60 | HR 108 | Temp 98.2°F | Resp 20

## 2023-05-15 DIAGNOSIS — R21 Rash and other nonspecific skin eruption: Secondary | ICD-10-CM | POA: Diagnosis not present

## 2023-05-15 LAB — POCT FASTING CBG KUC MANUAL ENTRY: POCT Glucose (KUC): 124 mg/dL — AB (ref 70–99)

## 2023-05-15 MED ORDER — PREDNISONE 20 MG PO TABS: 40.0000 mg | ORAL_TABLET | Freq: Every day | ORAL | 0 refills | Status: AC

## 2023-05-15 MED ORDER — DEXAMETHASONE SODIUM PHOSPHATE 10 MG/ML IJ SOLN
10.0000 mg | INTRAMUSCULAR | Status: AC
  Administered 2023-05-15: 10 mg via INTRAMUSCULAR

## 2023-05-15 MED ORDER — VALACYCLOVIR HCL 1 G PO TABS: 1000.0000 mg | ORAL_TABLET | Freq: Three times a day (TID) | ORAL | 0 refills | Status: DC

## 2023-05-15 NOTE — Discharge Instructions (Signed)
 You have been given an injection of Decadron  10 mg.  Start the prednisone  on 05/16/2023. Take medication as prescribed. May take over-the-counter Tylenol  as needed for pain, fever, or general discomfort. Apply cool compresses to the affected area as needed for pain or discomfort. Do not rub, scratch, or manipulate the area while symptoms persist. Keep the area covered if you notice that it begins to drain. If you are not noticing any improvement over the next 48 to 72 hours, you may follow-up in this clinic or with your primary care physician for reevaluation. Follow-up as needed.

## 2023-05-15 NOTE — ED Triage Notes (Signed)
 Pt reports after mowing the lawn she has swelling redness and and pain on the left side of her neck x  4 days. Unsure if she has gotten bitten by an insect or not

## 2023-05-16 NOTE — ED Provider Notes (Signed)
 [facility]    CSN: [id number] Arrival date & time: 05/15/23  1829      History   Chief Complaint Chief Complaint  Patient presents with   Insect Bite    Swelling and fever and hurting - Entered by patient    HPI [name] is a 58 y.o. female.   The history is provided by the patient.   Patient presents for complaints of swelling, redness, and pain to the left side of her neck.  Symptoms have been present for the past 4 days.  Patient states symptoms started after she was mowing the lawn.  She states that she is unsure if she may have been bitten by an insect or not.  Patient states that the area extends into the nape of her neck and into her hairline.  States that she has been using over-the-counter creams for her symptoms.  She states that she has felt "knots" under her skin which she has "popped" but states they came back.  Patient denies fever, chills, chest pain, abdominal pain, nausea, vomiting, or diarrhea.  Past Medical History:  Diagnosis Date   Asthma    Bipolar disease, chronic (HCC)    Cancer (HCC)    cervical cancer. partial hysterectomy in 20s.    Cervical cancer (HCC)    COPD (chronic obstructive pulmonary disease) (HCC)    DDD (degenerative disc disease)    Diabetes mellitus    not on medication   Factor 5 Leiden mutation, heterozygous (HCC)    GERD (gastroesophageal reflux disease)    Hep C w/o coma, chronic (HCC)    genotype 1 A without cirrhosis s/p treatment with Harvoni completed Jan 2018, HCV RNA not detected March 2018.   Hypertension    MRSA (methicillin resistant Staphylococcus aureus) carrier    Pneumonia    Seizures [facility])     Patient Active Problem List   Diagnosis Date Noted   History of hepatitis C 08/29/2020   Rectal bleeding 08/29/2020   Smoker 05/22/2020   S/P hysterectomy 05/22/2020   Vaginal Pap smear following hysterectomy for malignancy 05/20/2017   Constipation 03/27/2016   Right wrist pain 02/26/2016    Factor V Leiden (HCC) 10/04/2013    Past Surgical History:  Procedure Laterality Date   ABDOMINAL HYSTERECTOMY     cervical cancer   BACK SURGERY     carpel tunnel     COLONOSCOPY  20 yrs   [facility]   COLONOSCOPY WITH PROPOFOL  N/A 10/22/2020   Procedure: COLONOSCOPY WITH PROPOFOL ;  Surgeon: Vinetta Greening, DO;  [facility];  Service: Endoscopy;  Laterality: N/A;  9:00am   MASS EXCISION     POLYPECTOMY  10/22/2020   Procedure: POLYPECTOMY;  Surgeon: Vinetta Greening, DO;  Location: [facility];  Service: Endoscopy;;    OB History     Gravida  1   Para  1   Term  1   Preterm      AB      Living  1      SAB      IAB      Ectopic      Multiple      Live Births  1            Home Medications    Prior to Admission medications   Medication Sig Start Date End Date Taking? Authorizing Provider  predniSONE  (DELTASONE ) 20 MG tablet Take 2 tablets (40 mg total) by mouth daily  with breakfast for 5 days. 05/15/23 05/20/23 Yes Leath-Warren, Belen Bowers, NP  valACYclovir (VALTREX) 1000 MG tablet Take 1 tablet (1,000 mg total) by mouth 3 (three) times daily. 05/15/23  Yes Leath-Warren, Belen Bowers, NP  acetaminophen  (TYLENOL ) 325 MG tablet Take 650 mg by mouth every 6 (six) hours as needed for moderate pain.    [provider]  albuterol  (PROVENTIL  HFA;VENTOLIN  HFA) 108 (90 BASE) MCG/ACT inhaler Inhale 2 puffs into the lungs every 6 (six) hours as needed. For shortness of breath    [provider]  albuterol  (PROVENTIL ) (2.5 MG/3ML) 0.083% nebulizer solution Take 2.5 mg by nebulization 2 (two) times daily as needed for wheezing or shortness of breath.    [provider]  clindamycin  (CLEOCIN ) 300 MG capsule Take 1 capsule (300 mg total) by mouth 2 (two) times daily. Patient not taking: Reported on 01/25/2023 09/22/22   Corbin Dess, PA-C  cyclobenzaprine  (FLEXERIL ) 10 MG tablet Take by mouth. Patient not taking: Reported on  01/25/2023 04/27/16   [provider]  Docusate Sodium (DSS) 100 MG CAPS Take by mouth. 02/26/16   [provider]  doxycycline  (VIBRAMYCIN ) 100 MG capsule Take by mouth. Patient not taking: Reported on 01/25/2023 05/05/16   [provider]  erythromycin  ophthalmic ointment Place a 1/2 inch ribbon of ointment into the left lower eyelid BID prn. Patient not taking: Reported on 01/25/2023 01/23/22   Corbin Dess, PA-C  esomeprazole (NEXIUM) 20 MG capsule Take 20 mg by mouth daily. Patient not taking: Reported on 01/25/2023 09/12/20   [provider]  fluticasone-salmeterol (ADVAIR DISKUS) 250-50 MCG/ACT AEPB Inhale into the lungs. Patient not taking: Reported on 01/25/2023 02/26/16   [provider]  Fluticasone-Umeclidin-Vilant (TRELEGY ELLIPTA) 100-62.5-25 MCG/INH AEPB Inhale 1 puff into the lungs daily.    [provider]  gabapentin (NEURONTIN) 300 MG capsule Take 600-1,200 mg by mouth See admin instructions. 600 mg in the morning, and 1200 mg at bedtime    [provider]  gabapentin (NEURONTIN) 600 MG tablet Take by mouth. 02/26/16   [provider]  guaiFENesin  (MUCINEX ) 600 MG 12 hr tablet Take 1 tablet (600 mg total) by mouth 2 (two) times daily. Patient not taking: Reported on 01/25/2023 03/21/22   Corbin Dess, PA-C  hydrochlorothiazide (HYDRODIURIL) 25 MG tablet Take 25 mg by mouth daily.    [provider]  ipratropium-albuterol  (DUONEB) 0.5-2.5 (3) MG/3ML SOLN Take 3 mLs by nebulization every 4 (four) hours as needed. 03/21/22   Corbin Dess, PA-C  levalbuterol (XOPENEX) 1.25 MG/3ML nebulizer solution Take 1.25 mg by nebulization every 4 (four) hours as needed for wheezing or shortness of breath. Patient not taking: Reported on 10/18/2020    [provider]  loperamide  (IMODIUM ) 2 MG capsule Take 1 capsule (2 mg total) by mouth 4 (four) times daily as needed for diarrhea or loose  stools. Do not take more than 8 tablets in a 24-hour period. 01/25/23   Leath-Warren, Belen Bowers, NP  loratadine (CLARITIN) 10 MG tablet Take 10 mg by mouth daily.    [provider]  lubiprostone  (AMITIZA ) 24 MCG capsule Take by mouth. Patient not taking: Reported on 01/25/2023 03/27/16   [provider]  metFORMIN (GLUCOPHAGE) 500 MG tablet Take 300 mg by mouth 2 (two) times daily with a meal.    [provider]  mupirocin ointment (BACTROBAN) 2 % Apply 1 application topically 2 (two) times daily as needed (sores). Patient not taking: Reported on  01/25/2023    [provider]  omeprazole  (PRILOSEC) 20 MG capsule Take by mouth. 10/09/15   [provider]  polyethylene glycol-electrolytes (NULYTELY) 420 g solution As directed Patient not taking: Reported on 01/25/2023 08/29/20   Carver, Charles K, DO  potassium chloride (KLOR-CON) 10 MEQ tablet Take by mouth. 02/26/16   [provider]  Potassium Chloride ER 20 MEQ TBCR Take 20 mEq daily by mouth.     [provider]  promethazine -dextromethorphan (PROMETHAZINE -DM) 6.25-15 MG/5ML syrup Take 5 mLs by mouth 4 (four) times daily as needed. 01/25/23   Leath-Warren, Belen Bowers, NP  umeclidinium bromide (INCRUSE ELLIPTA) 62.5 MCG/ACT AEPB Inhale into the lungs. 02/26/16   [provider]    Family History Family History  Problem Relation Age of Onset   Diabetes Mother    Cancer Mother        breast    Hypertension Mother    Diverticulitis Mother        required surgery   Other Father        brain tumor, fluid around heart   Factor V Leiden deficiency Father    Drug abuse Sister    Other Brother        MVA   Cancer Maternal Grandmother    Diabetes Maternal Grandfather    Cancer Paternal Grandfather    Cancer Other    Colon cancer Neg Hx    Liver disease Neg Hx     Social History Social History   Tobacco Use   Smoking status: Every Day    Current packs/day: 1.00     Average packs/day: 1 pack/day for 40.0 years (40.0 ttl pk-yrs)    Types: Cigarettes   Smokeless tobacco: Never  Vaping Use   Vaping status: Never Used  Substance Use Topics   Alcohol use: No   Drug use: Yes    Types: Marijuana    Comment: twice a week     Allergies   Cephalexin, Cephalexin, Amoxicillin -pot clavulanate, Aspirin, Celecoxib, Imipramine hcl, Other, Aripiprazole, Penicillins, Seroquel [quetiapine fumarate], and Sulfa antibiotics   Review of Systems Review of Systems Per HPI  Physical Exam Triage Vital Signs ED Triage Vitals  Encounter Vitals Group     BP 05/15/23 1841 109/60     Systolic BP Percentile --      Diastolic BP Percentile --      Pulse Rate 05/15/23 1841 (!) 108     Resp 05/15/23 1841 20     Temp 05/15/23 1841 98.2 F (36.8 C)     Temp Source 05/15/23 1841 Oral     SpO2 05/15/23 1841 96 %     Weight --      Height --      Head Circumference --      Peak Flow --      Pain Score 05/15/23 1840 6     Pain Loc --      Pain Education --      Exclude from Growth Chart --    No data found.  Updated Vital Signs BP 109/60 (BP Location: Right Arm)   Pulse (!) 108   Temp 98.2 F (36.8 C) (Oral)   Resp 20   SpO2 96%   Visual Acuity Right Eye Distance:   Left Eye Distance:   Bilateral Distance:    Right Eye Near:   Left Eye Near:    Bilateral Near:     Physical Exam Vitals and nursing note reviewed.  Constitutional:  General: She is not in acute distress.    Appearance: Normal appearance.  HENT:     Head: Normocephalic.  Eyes:     Extraocular Movements: Extraocular movements intact.     Pupils: Pupils are equal, round, and reactive to light.  Cardiovascular:     Rate and Rhythm: Normal rate and regular rhythm.     Pulses: Normal pulses.     Heart sounds: Normal heart sounds.  Pulmonary:     Effort: Pulmonary effort is normal.     Breath sounds: Normal breath sounds.  Abdominal:     General: Bowel sounds are normal.      Palpations: Abdomen is soft.  Musculoskeletal:     Cervical back: Normal range of motion.  Skin:    General: Skin is warm and dry.     Findings: Rash present. Rash is vesicular.       Neurological:     General: No focal deficit present.     Mental Status: She is alert and oriented to person, place, and time.  Psychiatric:        Mood and Affect: Mood normal.        Behavior: Behavior normal.      UC Treatments / Results  Labs (all labs ordered are listed, but only abnormal results are displayed) Labs Reviewed  POCT FASTING CBG [facility] MANUAL ENTRY - Abnormal; Notable for the following components:      Result Value   POCT Glucose ([facility]) 124 (*)    All other components within normal limits    EKG   Radiology No results found.  Procedures Procedures (including critical care time)  Medications Ordered in UC Medications  dexamethasone  (DECADRON ) injection 10 mg (10 mg Intramuscular Given 05/15/23 1908)    Initial Impression / Assessment and Plan / UC Course  I have reviewed the triage vital signs and the nursing notes.  Pertinent labs & imaging results that were available during my care of the patient were reviewed by me and considered in my medical decision making (see chart for details).  Symptoms consistent with shingles.  Decadron  10 mg IM administered for inflammation.  Will start valacyclovir 1000 mg.  Prednisone  40 mg prescribed for inflammation.  Supportive care recommendations were provided and discussed with the patient to include over-the-counter analgesics, cool compresses to the area, and avoiding manipulating or scratching the areas while symptoms persist.  Discussed indications with patient regarding follow-up.  Patient was in agreement with this plan of care and verbalizes understanding.  All questions were answered.  Patient stable for discharge.   Final Clinical Impressions(s) / UC Diagnoses   Final diagnoses:  Rash and nonspecific skin eruption    Discharge Instructions      You have been given an injection of Decadron  10 mg.  Start the prednisone  on 05/16/2023. Take medication as prescribed. May take over-the-counter Tylenol  as needed for pain, fever, or general discomfort. Apply cool compresses to the affected area as needed for pain or discomfort. Do not rub, scratch, or manipulate the area while symptoms persist. Keep the area covered if you notice that it begins to drain. If you are not noticing any improvement over the next 48 to 72 hours, you may follow-up in this clinic or with your primary care physician for reevaluation. Follow-up as needed.  ED Prescriptions     Medication Sig Dispense Auth. Provider   valACYclovir (VALTREX) 1000 MG tablet Take 1 tablet (1,000 mg total) by mouth 3 (three) times daily. 21 tablet  Leath-Warren, Belen Bowers, NP   predniSONE  (DELTASONE ) 20 MG tablet Take 2 tablets (40 mg total) by mouth daily with breakfast for 5 days. 10 tablet Leath-Warren, Belen Bowers, NP      PDMP not reviewed this encounter.   Hardy Lia, NP 05/16/23 [phone]

## 2023-08-03 ENCOUNTER — Other Ambulatory Visit (HOSPITAL_COMMUNITY): Payer: Self-pay | Admitting: Internal Medicine

## 2023-08-03 DIAGNOSIS — Z1231 Encounter for screening mammogram for malignant neoplasm of breast: Secondary | ICD-10-CM

## 2023-08-13 ENCOUNTER — Encounter (HOSPITAL_COMMUNITY): Payer: Self-pay

## 2023-08-13 ENCOUNTER — Ambulatory Visit (HOSPITAL_COMMUNITY)
Admission: RE | Admit: 2023-08-13 | Discharge: 2023-08-13 | Disposition: A | Discharge status: Home / Self Care | Source: Ambulatory Visit | Attending: Internal Medicine | Admitting: Internal Medicine

## 2023-08-13 DIAGNOSIS — Z1231 Encounter for screening mammogram for malignant neoplasm of breast: Secondary | ICD-10-CM | POA: Diagnosis present

## 2023-08-18 ENCOUNTER — Ambulatory Visit: Admitting: Adult Health

## 2023-08-18 ENCOUNTER — Other Ambulatory Visit (HOSPITAL_COMMUNITY)
Admission: RE | Admit: 2023-08-18 | Discharge: 2023-08-18 | Disposition: A | Discharge status: Home / Self Care | Source: Ambulatory Visit | Attending: Adult Health | Admitting: Adult Health

## 2023-08-18 ENCOUNTER — Encounter: Payer: Self-pay | Admitting: Adult Health

## 2023-08-18 VITALS — BP 124/72 | HR 86 | Ht 62.0 in | Wt 155.5 lb

## 2023-08-18 DIAGNOSIS — Z01419 Encounter for gynecological examination (general) (routine) without abnormal findings: Secondary | ICD-10-CM

## 2023-08-18 DIAGNOSIS — Z08 Encounter for follow-up examination after completed treatment for malignant neoplasm: Secondary | ICD-10-CM | POA: Diagnosis present

## 2023-08-18 DIAGNOSIS — Z1331 Encounter for screening for depression: Secondary | ICD-10-CM

## 2023-08-18 DIAGNOSIS — Z90710 Acquired absence of both cervix and uterus: Secondary | ICD-10-CM

## 2023-08-18 DIAGNOSIS — F17200 Nicotine dependence, unspecified, uncomplicated: Secondary | ICD-10-CM | POA: Diagnosis not present

## 2023-08-18 DIAGNOSIS — A630 Anogenital (venereal) warts: Secondary | ICD-10-CM

## 2023-08-18 DIAGNOSIS — Z0000 Encounter for general adult medical examination without abnormal findings: Secondary | ICD-10-CM | POA: Diagnosis not present

## 2023-08-18 DIAGNOSIS — R062 Wheezing: Secondary | ICD-10-CM

## 2023-08-18 NOTE — Progress Notes (Signed)
 Patient ID: [name], female   DOB: [birth year], 58 y.o.   MRN: [id number] History of Present Illness: [name] is a 58 year old white female,single, sp hysterectomy for cervical cancer in her 20's, in for well woman gyn exam and pap.  PCP is Dr Carlette   Current Medications, Allergies, Past Medical History, Past Surgical History, Family History and Social History were reviewed in [employer] electronic medical record.     Review of Systems: Patient denies any headaches, hearing loss, fatigue, blurred vision, shortness of breath, chest pain, abdominal pain, problems with bowel movements, urination, or intercourse(not active). No mood swings.  Has ?warts, and thinking about having sex Has body aches, back and hands   Physical Exam:BP 124/72 (BP Location: Right Arm, Patient Position: Sitting, Cuff Size: Normal)   Pulse 86   Ht 5' 2 (1.575 m)   Wt 155 lb 8 oz (70.5 kg)   BMI 28.44 kg/m   General:  Well developed, well nourished, no acute distress Skin:  Warm and dry Neck:  Midline trachea, normal thyroid, good ROM, no lymphadenopathy Lungs: has + wheezing, clears on right when coughs but not left Breast:  No dominant palpable mass, retraction, or nipple discharge Cardiovascular: Regular rate and rhythm Abdomen:  Soft, non tender, no hepatosplenomegaly Pelvic:  External genitalia is normal in appearance, has 2 small warts near introitus.  The vagina is pale, no lesions, vaginal pap with HR HPV genotyping and GC/CHL performed. Urethra has no lesions or masses. The cervix  and uterus are absent, No adnexal masses or tenderness noted.Bladder is non tender, no masses felt. Rectal: deferred Extremities/musculoskeletal:  No swelling or varicosities noted, no clubbing or cyanosis Psych:  No mood changes, alert and cooperative,seems happy AA is 1    08/18/2023   11:57 AM 05/22/2020    9:13 AM 05/20/2017   10:39 AM  Depression screen PHQ 2/9  Decreased Interest 0 0 0  Down, Depressed,  Hopeless 0 0 0  PHQ - 2 Score 0 0 0  Altered sleeping 0 0   Tired, decreased energy 0 0   Change in appetite 0 0   Feeling bad or failure about yourself  0 0   Trouble concentrating 0 0   Moving slowly or fidgety/restless 0 0   Suicidal thoughts 0 0   PHQ-9 Score 0 0        08/18/2023   11:58 AM 05/22/2020    9:13 AM  GAD 7 : Generalized Anxiety Score  Nervous, Anxious, on Edge 0 0  Control/stop worrying 0 0  Worry too much - different things 0 0  Trouble relaxing 0 0  Restless 0 0  Easily annoyed or irritable 0 0  Afraid - awful might happen 0 0  Total GAD 7 Score 0 0    Upstream - 08/18/23 1154       Pregnancy Intention Screening   Does the patient want to become pregnant in the next year? N/A    Does the patient's partner want to become pregnant in the next year? N/A    Would the patient like to discuss contraceptive options today? N/A      Contraception Wrap Up   Current Method Female Sterilization   hyst   End Method Female Sterilization   hyst   Contraception Counseling Provided No           Examination chaperoned by Clarita Salt LPN   Impression and plan: 1. Vaginal Pap smear following hysterectomy for malignancy  Pap sent  - Cytology - PAP( [facility])  2. S/P hysterectomy   3. Smoker Try to cut down   4. Encounter for well woman exam with routine gynecological exam (Primary) Pap sent Physical in 1 year Labs with PCP Mammogram was negative 08/13/23 Colonoscopy per GI   5. Genital warts Use condoms with good lubricate if has sex   6. Wheezing Try to decrease smoking and cough often to clear   Has CT chest 02/09/23

## 2023-08-20 ENCOUNTER — Ambulatory Visit: Payer: Self-pay | Admitting: Adult Health

## 2023-08-20 LAB — CYTOLOGY - PAP
Chlamydia: NEGATIVE
Comment: NEGATIVE
Comment: NEGATIVE
Comment: NORMAL
Diagnosis: NEGATIVE
High risk HPV: NEGATIVE
Neisseria Gonorrhea: NEGATIVE

## 2023-08-20 NOTE — Telephone Encounter (Signed)
-----   Message from Delon Lewis sent at 08/20/2023  8:46 AM EDT ----- Let her know about pap.THX

## 2023-08-20 NOTE — Telephone Encounter (Signed)
 Pt aware pap was negative for malignancy, HPV and GC/CHL. Next pap due in 3 years. Pt voiced understanding. JSY

## 2023-08-22 ENCOUNTER — Ambulatory Visit
Admission: RE | Admit: 2023-08-22 | Discharge: 2023-08-22 | Disposition: A | Discharge status: Home / Self Care | Source: Ambulatory Visit | Attending: Nurse Practitioner | Admitting: Nurse Practitioner

## 2023-08-22 ENCOUNTER — Ambulatory Visit (INDEPENDENT_AMBULATORY_CARE_PROVIDER_SITE_OTHER)

## 2023-08-22 VITALS — BP 157/88 | HR 73 | Temp 98.0°F | Resp 18

## 2023-08-22 DIAGNOSIS — M79672 Pain in left foot: Secondary | ICD-10-CM

## 2023-08-22 MED ORDER — DEXAMETHASONE SODIUM PHOSPHATE 10 MG/ML IJ SOLN
10.0000 mg | INTRAMUSCULAR | Status: AC
  Administered 2023-08-22: 10 mg via INTRAMUSCULAR

## 2023-08-22 MED ORDER — ACETAMINOPHEN ER 650 MG PO TBCR: 650.0000 mg | EXTENDED_RELEASE_TABLET | Freq: Three times a day (TID) | ORAL | 0 refills | Status: AC | PRN

## 2023-08-22 NOTE — ED Triage Notes (Signed)
 Patient presents to the office for a painful knot on the of her foot left foot. Patient denies any trauma or pain to her foot.  Home Medication: None

## 2023-08-22 NOTE — ED Provider Notes (Signed)
 [facility]    CSN: [id number] Arrival date & time: 08/22/23  9077      History   Chief Complaint Chief Complaint  Patient presents with   Foot Pain    HPI [name] is a 58 y.o. female.   The history is provided by the patient.   Patient presents with a 2-day history of pain and swelling to the left foot.  Patient states that there is a knot on the top of her left foot.  She states the area is tender to palpation, and radiates down to the bottom of her foot.  She denies any obvious injury or trauma.  She also denies numbness, tingling, or the inability to bear weigh  So far, states she has tried Tylenol  and taken muscle relaxers for her symptoms with minimal relief. Past Medical History:  Diagnosis Date   Arthritis    Asthma    Bipolar disease, chronic (HCC)    Cancer (HCC)    cervical cancer. partial hysterectomy in 20s.    Cervical cancer (HCC)    COPD (chronic obstructive pulmonary disease) (HCC)    DDD (degenerative disc disease)    Diabetes mellitus    not on medication   Factor 5 Leiden mutation, heterozygous (HCC)    GERD (gastroesophageal reflux disease)    Hep C w/o coma, chronic (HCC)    genotype 1 A without cirrhosis s/p treatment with Harvoni completed Jan 2018, HCV RNA not detected March 2018.   Hypertension    MRSA (methicillin resistant Staphylococcus aureus) carrier    Pneumonia    Seizures [facility])     Patient Active Problem List   Diagnosis Date Noted   Wheezing 08/18/2023   Genital warts 08/18/2023   Encounter for well woman exam with routine gynecological exam 08/18/2023   History of hepatitis C 08/29/2020   Rectal bleeding 08/29/2020   Smoker 05/22/2020   S/P hysterectomy 05/22/2020   Vaginal Pap smear following hysterectomy for malignancy 05/20/2017   Constipation 03/27/2016   Right wrist pain 02/26/2016   Factor V Leiden (HCC) 10/04/2013    Past Surgical History:  Procedure Laterality Date   ABDOMINAL HYSTERECTOMY      cervical cancer   BACK SURGERY     carpel tunnel     COLONOSCOPY  20 yrs   [place]   COLONOSCOPY WITH PROPOFOL  N/A 10/22/2020   Procedure: COLONOSCOPY WITH PROPOFOL ;  Surgeon: Cindie Carlin POUR, DO;  [facility];  Service: Endoscopy;  Laterality: N/A;  9:00am   MASS EXCISION     POLYPECTOMY  10/22/2020   Procedure: POLYPECTOMY;  Surgeon: Cindie Carlin POUR, DO;  Location: [facility];  Service: Endoscopy;;    OB History     Gravida  1   Para  1   Term  1   Preterm      AB      Living  1      SAB      IAB      Ectopic      Multiple      Live Births  1            Home Medications    Prior to Admission medications   Medication Sig Start Date End Date Taking? Authorizing Provider  acetaminophen  (TYLENOL ) 325 MG tablet Take 650 mg by mouth every 6 (six) hours as needed for moderate pain.   Yes [provider]  gabapentin (NEURONTIN) 300 MG capsule Take 600-1,200 mg  by mouth See admin instructions. 600 mg in the morning, and 1200 mg at bedtime Patient taking differently: Take 300 mg by mouth 2 (two) times daily.   Yes [provider]  hydrochlorothiazide (HYDRODIURIL) 25 MG tablet Take 25 mg by mouth daily.   Yes [provider]  loratadine (CLARITIN) 10 MG tablet Take 10 mg by mouth daily.   Yes [provider]  metFORMIN (GLUCOPHAGE) 500 MG tablet Take 300 mg by mouth 2 (two) times daily with a meal. Patient taking differently: Take 500 mg by mouth 2 (two) times daily with a meal.   Yes [provider]  omeprazole  (PRILOSEC) 20 MG capsule Take by mouth. 10/09/15  Yes [provider]  Potassium Chloride ER 20 MEQ TBCR Take 20 mEq daily by mouth.    Yes [provider]  albuterol  (PROVENTIL  HFA;VENTOLIN  HFA) 108 (90 BASE) MCG/ACT inhaler Inhale 2 puffs into the lungs every 6 (six) hours as needed. For shortness of breath    [provider]  albuterol  (PROVENTIL ) (2.5 MG/3ML)  0.083% nebulizer solution Take 2.5 mg by nebulization 2 (two) times daily as needed for wheezing or shortness of breath.    [provider]  cyclobenzaprine  (FLEXERIL ) 10 MG tablet Take by mouth. 04/27/16   [provider]  Fluticasone-Umeclidin-Vilant (TRELEGY ELLIPTA) 100-62.5-25 MCG/INH AEPB Inhale 1 puff into the lungs daily.    [provider]    Family History Family History  Problem Relation Age of Onset   Breast cancer Mother 1   Diabetes Mother    Cancer Mother        breast    Hypertension Mother    Diverticulitis Mother        required surgery   Other Father        brain tumor, fluid around heart   Factor V Leiden deficiency Father    Drug abuse Sister    Cancer Maternal Grandmother    Diabetes Maternal Grandfather    Cancer Paternal Grandfather    Other Brother        MVA   Cancer Other    Colon cancer Neg Hx    Liver disease Neg Hx     Social History Social History   Tobacco Use   Smoking status: Every Day    Current packs/day: 1.00    Average packs/day: 1 pack/day for 40.0 years (40.0 ttl pk-yrs)    Types: Cigarettes   Smokeless tobacco: Never  Vaping Use   Vaping status: Never Used  Substance Use Topics   Alcohol use: Yes    Comment: yearly   Drug use: Yes    Types: Marijuana    Comment: twice a week     Allergies   Cephalexin, Cephalexin, Amoxicillin -pot clavulanate, Aspirin, Celecoxib, Imipramine hcl, Aripiprazole, Penicillins, Seroquel [quetiapine fumarate], and Sulfa antibiotics   Review of Systems Review of Systems Per HPI  Physical Exam Triage Vital Signs ED Triage Vitals [08/22/23 0954]  Encounter Vitals Group     BP (!) 157/88     Girls Systolic BP Percentile      Girls Diastolic BP Percentile      Boys Systolic BP Percentile      Boys Diastolic BP Percentile      Pulse Rate 73     Resp 18     Temp 98 F (36.7 C)     Temp Source Oral     SpO2 95 %     Weight      Height  Head Circumference       Peak Flow      Pain Score      Pain Loc      Pain Education      Exclude from Growth Chart    No data found.  Updated Vital Signs BP (!) 157/88 (BP Location: Left Arm)   Pulse 73   Temp 98 F (36.7 C) (Oral)   Resp 18   SpO2 95%   Visual Acuity Right Eye Distance:   Left Eye Distance:   Bilateral Distance:    Right Eye Near:   Left Eye Near:    Bilateral Near:     Physical Exam Vitals and nursing note reviewed.  Constitutional:      General: She is not in acute distress.    Appearance: Normal appearance.  HENT:     Head: Normocephalic.  Eyes:     Extraocular Movements: Extraocular movements intact.     Pupils: Pupils are equal, round, and reactive to light.  Pulmonary:     Effort: Pulmonary effort is normal.  Musculoskeletal:     Cervical back: Normal range of motion.       Feet:  Skin:    General: Skin is warm and dry.  Neurological:     General: No focal deficit present.     Mental Status: She is alert and oriented to person, place, and time.  Psychiatric:        Mood and Affect: Mood normal.        Behavior: Behavior normal.      UC Treatments / Results  Labs (all labs ordered are listed, but only abnormal results are displayed) Labs Reviewed - No data to display  EKG   Radiology DG Foot Complete Left Result Date: 08/22/2023 EXAM: 3 or more VIEW(S) XRAY OF THE LEFT FOOT 08/22/2023 10:28:56 AM COMPARISON: None available. CLINICAL HISTORY: Pain and swelling in left foot x 2 days. Patient states painful knot on the top of her foot left foot. No known injury. FINDINGS: BONES AND JOINTS: No acute osseous abnormality is present. SOFT TISSUES: Calcifications are present at the achilles tendon insertion. Soft tissue swelling is present over the dorsum of the foot. IMPRESSION: 1. Soft tissue swelling over the dorsum of the left foot. 2. No acute osseous abnormality. Electronically signed by: Lonni Necessary MD 08/22/2023 10:34 AM EDT RP Workstation:  [id number]    Procedures Procedures (including critical care time)  Medications Ordered in UC Medications  dexamethasone  (DECADRON ) injection 10 mg (has no administration in time range)    Initial Impression / Assessment and Plan / UC Course  I have reviewed the triage vital signs and the nursing notes.  Pertinent labs & imaging results that were available during my care of the patient were reviewed by me and considered in my medical decision making (see chart for details).  X-ray of the left foot is negative for fracture or dislocation, it does show soft tissue swelling noted over the dorsum of the left foot.  Decadron  10 mg IM administered for inflammation.  Ace wrap was provided to help with additional compression and support.  Tylenol  650 mg prescribed for pain.  Supportive care recommendations were provided and discussed with the patient to include RICE therapy.  Discussed indications with patient regarding follow-up.  Patient was in agreement with this plan of care and verbalizes understanding.  All questions were answered.  Patient stable for discharge.  Final Clinical Impressions(s) / UC Diagnoses   Final diagnoses:  Left foot pain   Discharge Instructions   None    ED Prescriptions   None    PDMP not reviewed this encounter.   Gilmer Etta PARAS, NP 08/22/23 1047

## 2023-08-22 NOTE — Discharge Instructions (Addendum)
 The x-ray was negative for fracture or dislocation. You were given an injection of Decadron  10 mg today to help with swelling and inflammation. Take medication as prescribed. An Ace wrap has been provided to allow for additional compression and support.  Wear the Ace wrap while you are experiencing swelling and when you are engaged in prolonged or strenuous activity. RICE therapy, rest, ice, compression, and elevation.  Elevate the left foot is much as possible to help with pain or swelling.  Apply ice, which will also help with pain and swelling.  Apply for 20 minutes, remove for 1 hour, repeat as needed while swelling persist. If swelling does not improve over the next 1 to 2 weeks, or appears to worsen, recommend follow-up with orthopedics or podiatry for further evaluation. Follow-up as needed.

## 2023-08-31 ENCOUNTER — Other Ambulatory Visit (HOSPITAL_COMMUNITY): Payer: Self-pay | Admitting: Gerontology

## 2023-08-31 DIAGNOSIS — F17210 Nicotine dependence, cigarettes, uncomplicated: Secondary | ICD-10-CM

## 2023-10-24 ENCOUNTER — Ambulatory Visit
Admission: RE | Admit: 2023-10-24 | Discharge: 2023-10-24 | Disposition: A | Discharge status: Home / Self Care | Payer: Self-pay | Source: Ambulatory Visit | Attending: Nurse Practitioner | Admitting: Nurse Practitioner

## 2023-10-24 VITALS — BP 151/104 | HR 105 | Temp 97.8°F | Resp 20

## 2023-10-24 DIAGNOSIS — M79672 Pain in left foot: Secondary | ICD-10-CM | POA: Diagnosis not present

## 2023-10-24 MED ORDER — DICLOFENAC SODIUM 1 % EX GEL: 4.0000 g | Freq: Four times a day (QID) | CUTANEOUS | 0 refills | Status: AC

## 2023-10-24 MED ORDER — DEXAMETHASONE SOD PHOSPHATE PF 10 MG/ML IJ SOLN
10.0000 mg | Freq: Once | INTRAMUSCULAR | Status: AC
  Administered 2023-10-24: 10 mg via INTRAMUSCULAR

## 2023-10-24 NOTE — Discharge Instructions (Signed)
 You were given an injection of Decadron  10 mg today to help with swelling and inflammation.  Do not take any additional NSAIDs today to include ibuprofen , Aleve, Motrin , Advil , or naproxen.  You may take Tylenol  for breakthrough pain or discomfort. Apply medication as prescribed. RICE therapy, rest, ice, compression, and elevation. Elevate the left foot is much as possible to help with pain or swelling. Apply ice, which will also help with pain and swelling. Apply for 20 minutes, remove for 1 hour, repeat as needed while swelling persist. If swelling does not improve over the next 1 to 2 weeks, or appears to worsen, recommend follow-up with orthopedics or podiatry for further evaluation. Follow-up as needed.

## 2023-10-24 NOTE — ED Provider Notes (Signed)
 [facility]    CSN: [id number] Arrival date & time: 10/24/23  1100      History   Chief Complaint Chief Complaint  Patient presents with   Foot Pain    Entered by patient    HPI [name] is a 58 y.o. female.   The history is provided by the patient.   Patient presents with a 2-day history of pain and swelling to the top of the left foot. Patient states that there is a knot on the top of her left foot. She states the area is tender to palpation, and the area is sensitive to touch. She stated that the area started as itching to the 2nd and 3rd toes, then progressed to pain and swelling.  She denies any obvious injury or trauma. She also denies numbness, tingling, or the inability to bear weigh So far, states she has tried Tylenol  and Ibuprofen  for her symptoms.  Patient was seen for the same or similar symptoms in this clinic in August.  States she did find some relief with the steroid.  Past Medical History:  Diagnosis Date   Arthritis    Asthma    Bipolar disease, chronic (HCC)    Cancer (HCC)    cervical cancer. partial hysterectomy in 20s.    Cervical cancer (HCC)    COPD (chronic obstructive pulmonary disease) (HCC)    DDD (degenerative disc disease)    Diabetes mellitus    not on medication   Factor 5 Leiden mutation, heterozygous    GERD (gastroesophageal reflux disease)    Hep C w/o coma, chronic (HCC)    genotype 1 A without cirrhosis s/p treatment with Harvoni completed Jan 2018, HCV RNA not detected March 2018.   Hypertension    MRSA (methicillin resistant Staphylococcus aureus) carrier    Pneumonia    Seizures [facility])     Patient Active Problem List   Diagnosis Date Noted   Wheezing 08/18/2023   Genital warts 08/18/2023   Encounter for well woman exam with routine gynecological exam 08/18/2023   History of hepatitis C 08/29/2020   Rectal bleeding 08/29/2020   Smoker 05/22/2020   S/P hysterectomy 05/22/2020   Vaginal Pap smear  following hysterectomy for malignancy 05/20/2017   Constipation 03/27/2016   Right wrist pain 02/26/2016   Factor V Leiden 10/04/2013    Past Surgical History:  Procedure Laterality Date   ABDOMINAL HYSTERECTOMY     cervical cancer   BACK SURGERY     carpel tunnel     COLONOSCOPY  20 yrs   [place]   COLONOSCOPY WITH PROPOFOL  N/A 10/22/2020   Procedure: COLONOSCOPY WITH PROPOFOL ;  Surgeon: Cindie Carlin POUR, DO;  [facility];  Service: Endoscopy;  Laterality: N/A;  9:00am   MASS EXCISION     POLYPECTOMY  10/22/2020   Procedure: POLYPECTOMY;  Surgeon: Cindie Carlin POUR, DO;  [facility];  Service: Endoscopy;;    OB History     Gravida  1   Para  1   Term  1   Preterm      AB      Living  1      SAB      IAB      Ectopic      Multiple      Live Births  1            Home Medications    Prior to Admission medications   Medication Sig  Start Date End Date Taking? Authorizing Provider  acetaminophen  (TYLENOL  8 HOUR) 650 MG CR tablet Take 1 tablet (650 mg total) by mouth every 8 (eight) hours as needed for pain. 08/22/23   Leath-Warren, Etta PARAS, NP  albuterol  (PROVENTIL  HFA;VENTOLIN  HFA) 108 (90 BASE) MCG/ACT inhaler Inhale 2 puffs into the lungs every 6 (six) hours as needed. For shortness of breath    [provider]  albuterol  (PROVENTIL ) (2.5 MG/3ML) 0.083% nebulizer solution Take 2.5 mg by nebulization 2 (two) times daily as needed for wheezing or shortness of breath.    [provider]  cyclobenzaprine  (FLEXERIL ) 10 MG tablet Take by mouth. 04/27/16   [provider]  Fluticasone-Umeclidin-Vilant (TRELEGY ELLIPTA) 100-62.5-25 MCG/INH AEPB Inhale 1 puff into the lungs daily.    [provider]  gabapentin (NEURONTIN) 300 MG capsule Take 600-1,200 mg by mouth See admin instructions. 600 mg in the morning, and 1200 mg at bedtime Patient taking differently: Take 300 mg by mouth 2 (two) times daily.     [provider]  hydrochlorothiazide (HYDRODIURIL) 25 MG tablet Take 25 mg by mouth daily.    [provider]  loratadine (CLARITIN) 10 MG tablet Take 10 mg by mouth daily.    [provider]  metFORMIN (GLUCOPHAGE) 500 MG tablet Take 300 mg by mouth 2 (two) times daily with a meal. Patient taking differently: Take 500 mg by mouth 2 (two) times daily with a meal.    [provider]  omeprazole  (PRILOSEC) 20 MG capsule Take by mouth. 10/09/15   [provider]  Potassium Chloride ER 20 MEQ TBCR Take 20 mEq daily by mouth.     [provider]    Family History Family History  Problem Relation Age of Onset   Breast cancer Mother 21   Diabetes Mother    Cancer Mother        breast    Hypertension Mother    Diverticulitis Mother        required surgery   Other Father        brain tumor, fluid around heart   Factor V Leiden deficiency Father    Drug abuse Sister    Cancer Maternal Grandmother    Diabetes Maternal Grandfather    Cancer Paternal Grandfather    Other Brother        MVA   Cancer Other    Colon cancer Neg Hx    Liver disease Neg Hx     Social History Social History   Tobacco Use   Smoking status: Every Day    Current packs/day: 1.00    Average packs/day: 1 pack/day for 40.0 years (40.0 ttl pk-yrs)    Types: Cigarettes   Smokeless tobacco: Never  Vaping Use   Vaping status: Never Used  Substance Use Topics   Alcohol use: Yes    Comment: yearly   Drug use: Yes    Types: Marijuana    Comment: twice a week     Allergies   Cephalexin, Cephalexin, Amoxicillin -pot clavulanate, Aspirin, Celecoxib, Imipramine hcl, Aripiprazole, Penicillins, Seroquel [quetiapine fumarate], and Sulfa antibiotics   Review of Systems Review of Systems Per HPI  Physical Exam Triage Vital Signs ED Triage Vitals  Encounter Vitals Group     BP 10/24/23 1106 (!) 151/104     Girls Systolic BP Percentile --      Girls  Diastolic BP Percentile --      Boys Systolic BP Percentile --  Boys Diastolic BP Percentile --      Pulse Rate 10/24/23 1106 (!) 105     Resp 10/24/23 1106 20     Temp 10/24/23 1106 97.8 F (36.6 C)     Temp Source 10/24/23 1106 Oral     SpO2 10/24/23 1106 95 %     Weight --      Height --      Head Circumference --      Peak Flow --      Pain Score 10/24/23 1107 10     Pain Loc --      Pain Education --      Exclude from Growth Chart --    No data found.  Updated Vital Signs BP (!) 151/104 (BP Location: Right Arm)   Pulse (!) 105   Temp 97.8 F (36.6 C) (Oral)   Resp 20   SpO2 95%   Visual Acuity Right Eye Distance:   Left Eye Distance:   Bilateral Distance:    Right Eye Near:   Left Eye Near:    Bilateral Near:     Physical Exam Vitals and nursing note reviewed.  Constitutional:      General: She is not in acute distress.    Appearance: Normal appearance.  HENT:     Head: Normocephalic.  Eyes:     Extraocular Movements: Extraocular movements intact.     Pupils: Pupils are equal, round, and reactive to light.  Pulmonary:     Effort: Pulmonary effort is normal.  Musculoskeletal:     Cervical back: Normal range of motion.  Skin:    General: Skin is warm and dry.  Neurological:     General: No focal deficit present.     Mental Status: She is alert and oriented to person, place, and time.  Psychiatric:        Mood and Affect: Mood normal.        Behavior: Behavior normal.      UC Treatments / Results  Labs (all labs ordered are listed, but only abnormal results are displayed) Labs Reviewed - No data to display  EKG   Radiology No results found.  Procedures Procedures (including critical care time)  Medications Ordered in UC Medications - No data to display  Initial Impression / Assessment and Plan / UC Course  I have reviewed the triage vital signs and the nursing notes.  Pertinent labs & imaging results that were available during  my care of the patient were reviewed by me and considered in my medical decision making (see chart for details).   Patient presents for left foot pain.  Patient was seen in the clinic for the same or similar symptoms in August.  She did get some relief after steroids were administered at that time.  Decadron  10 mg administered for inflammation.  Will start patient on Voltaren gel for patient to apply topically.  Supportive care recommendations were provided and discussed with the patient to include over-the-counter Tylenol , and RICE therapy.  Patient was advised if symptoms fail to improve with this treatment, recommend follow-up with her PCP, orthopedics, or podiatry for further evaluation.  Patient was in agreement with this plan of care and verbalizes understanding.  All questions were answered.  Patient stable for discharge.  Final Clinical Impressions(s) / UC Diagnoses   Final diagnoses:  None   Discharge Instructions   None    ED Prescriptions   None    PDMP not reviewed this encounter.  Gilmer Etta PARAS, NP 10/24/23 1135

## 2023-10-24 NOTE — ED Triage Notes (Signed)
 Pt being seen in UC for L foot pain that began Thursday. Pt reports knot on foot that has been reoccurring. Pt reports increased pain and burning. Pt reports taking tylenol  and ibuprofen  with no relief.

## 2023-11-10 ENCOUNTER — Other Ambulatory Visit (INDEPENDENT_AMBULATORY_CARE_PROVIDER_SITE_OTHER): Payer: Self-pay

## 2023-11-10 ENCOUNTER — Encounter: Payer: Self-pay | Admitting: Orthopedic Surgery

## 2023-11-10 ENCOUNTER — Ambulatory Visit: Admitting: Orthopedic Surgery

## 2023-11-10 VITALS — BP 135/79 | HR 106 | Ht 62.0 in | Wt 155.0 lb

## 2023-11-10 DIAGNOSIS — M79672 Pain in left foot: Secondary | ICD-10-CM

## 2023-11-10 DIAGNOSIS — M19072 Primary osteoarthritis, left ankle and foot: Secondary | ICD-10-CM | POA: Diagnosis not present

## 2023-11-10 NOTE — Progress Notes (Signed)
 New Patient Visit  Assessment: [name] is a 58 y.o. female with the following: 1. Arthritis of left midfoot  Plan: [name] has pain in the midfoot of the left foot.  Radiographs demonstrate some degenerative changes.  The description of the flares that she has had multiple times this year sound like gout.  However, she does not have a history of gout.  I discussed the radiographic changes, and recommended a stiff soled shoe.  Medications as needed.  She can consider topical treatments.  More portly, if she has another flare, I have asked her to return to clinic, so we can visualize her symptoms at that time.  She states understanding.  She will follow-up as needed.  Follow-up: Return if symptoms worsen or fail to improve.  Subjective:  Chief Complaint  Patient presents with   Foot Pain    Left/ has painful flares that last 3-4 days, feeling better now states has happened 4 times this year this past time was the worst, she couldn't walk on it or touch it because of pain even a sheet on it was painful     History of Present Illness: [name] is a 58 y.o. female who presents for evaluation of left foot pain.  She states that she has chronic pain in the left foot.  She always has an ache in the midfoot area.  However, at least 4 times this year, she describes episodes where it gets red, inflamed and very tender to touch.  The pain will start in the forefoot area, and radiate to the midfoot.  When she has been seen in the emergency department, she was told that there was fluid in the midfoot area.  After a few days, the symptoms subside.  No specific injury to the left foot.  She reports that she has rolled her ankle several times.  She takes over-the-counter pain medicines.   Review of Systems: No fevers or chills No numbness or tingling No chest pain No shortness of breath No bowel or bladder dysfunction No GI distress No headaches   Medical  History:  Past Medical History:  Diagnosis Date   Arthritis    Asthma    Bipolar disease, chronic (HCC)    Cancer (HCC)    cervical cancer. partial hysterectomy in 20s.    Cervical cancer (HCC)    COPD (chronic obstructive pulmonary disease) (HCC)    DDD (degenerative disc disease)    Diabetes mellitus    not on medication   Factor 5 Leiden mutation, heterozygous    GERD (gastroesophageal reflux disease)    Hep C w/o coma, chronic (HCC)    genotype 1 A without cirrhosis s/p treatment with Harvoni completed Jan 2018, HCV RNA not detected March 2018.   Hypertension    MRSA (methicillin resistant Staphylococcus aureus) carrier    Pneumonia    Seizures (HCC)     Past Surgical History:  Procedure Laterality Date   ABDOMINAL HYSTERECTOMY     cervical cancer   BACK SURGERY     carpel tunnel     COLONOSCOPY  20 yrs   [place]   COLONOSCOPY WITH PROPOFOL  N/A 10/22/2020   Procedure: COLONOSCOPY WITH PROPOFOL ;  Surgeon: Cindie Carlin POUR, DO;  Location: [facility];  Service: Endoscopy;  Laterality: N/A;  9:00am   MASS EXCISION     POLYPECTOMY  10/22/2020   Procedure: POLYPECTOMY;  Surgeon: Cindie Carlin POUR, DO;  Location: [facility];  Service: Endoscopy;;  Family History  Problem Relation Age of Onset   Breast cancer Mother 79   Diabetes Mother    Cancer Mother        breast    Hypertension Mother    Diverticulitis Mother        required surgery   Other Father        brain tumor, fluid around heart   Factor V Leiden deficiency Father    Drug abuse Sister    Cancer Maternal Grandmother    Diabetes Maternal Grandfather    Cancer Paternal Grandfather    Other Brother        MVA   Cancer Other    Colon cancer Neg Hx    Liver disease Neg Hx    Social History   Tobacco Use   Smoking status: Every Day    Current packs/day: 1.00    Average packs/day: 1 pack/day for 40.0 years (40.0 ttl pk-yrs)    Types: Cigarettes   Smokeless tobacco: Never  Vaping Use    Vaping status: Never Used  Substance Use Topics   Alcohol use: Yes    Comment: yearly   Drug use: Yes    Types: Marijuana    Comment: twice a week    Allergies  Allergen Reactions   Cephalexin Shortness Of Breath   Cephalexin Nausea And Vomiting, Shortness Of Breath and Swelling   Amoxicillin -Pot Clavulanate Nausea And Vomiting   Aspirin Rash, Swelling and Nausea And Vomiting   Celecoxib Nausea And Vomiting   Imipramine Hcl Other (See Comments)    Facial twisting   Aripiprazole Nausea And Vomiting   Penicillins Nausea And Vomiting    Has patient had a PCN reaction causing immediate rash, facial/tongue/throat swelling, SOB or lightheadedness with hypotension: Yes  Has patient had a PCN reaction causing severe rash involving mucus membranes or skin necrosis: Yes  Has patient had a PCN reaction that required hospitalization No  Has patient had a PCN reaction occurring within the last 10 years: No  If all of the above answers are NO, then may proceed with Cephalosporin use.  Has patient had a PCN reaction causing immediate rash, facial/tongue/throat swelling, SOB or lightheadedness with hypotension: Yes, Has patient had a PCN reaction causing severe rash involving mucus membranes or skin necrosis: Yes, Has patient had a PCN reaction that required hospitalization No, Has patient had a PCN reaction occurring within the last 10 years: No, If all of the above answers are NO, then may proceed with Cephalosporin use.   Seroquel [Quetiapine Fumarate]     Stroke symptoms    Sulfa Antibiotics Rash and Other (See Comments)    Triggers headaches also    Current Meds  Medication Sig   acetaminophen  (TYLENOL  8 HOUR) 650 MG CR tablet Take 1 tablet (650 mg total) by mouth every 8 (eight) hours as needed for pain.   albuterol  (PROVENTIL  HFA;VENTOLIN  HFA) 108 (90 BASE) MCG/ACT inhaler Inhale 2 puffs into the lungs every 6 (six) hours as needed. For shortness of breath   albuterol  (PROVENTIL )  (2.5 MG/3ML) 0.083% nebulizer solution Take 2.5 mg by nebulization 2 (two) times daily as needed for wheezing or shortness of breath.   cyclobenzaprine  (FLEXERIL ) 10 MG tablet Take by mouth.   diclofenac Sodium (VOLTAREN ARTHRITIS PAIN) 1 % GEL Apply 4 g topically 4 (four) times daily.   Fluticasone-Umeclidin-Vilant (TRELEGY ELLIPTA) 100-62.5-25 MCG/INH AEPB Inhale 1 puff into the lungs daily.   gabapentin (NEURONTIN) 300 MG capsule Take 600-1,200 mg by  mouth See admin instructions. 600 mg in the morning, and 1200 mg at bedtime (Patient taking differently: Take 300 mg by mouth 2 (two) times daily.)   hydrochlorothiazide (HYDRODIURIL) 25 MG tablet Take 25 mg by mouth daily.   loratadine (CLARITIN) 10 MG tablet Take 10 mg by mouth daily.   metFORMIN (GLUCOPHAGE) 500 MG tablet Take 300 mg by mouth 2 (two) times daily with a meal. (Patient taking differently: Take 500 mg by mouth 2 (two) times daily with a meal.)   omeprazole  (PRILOSEC) 20 MG capsule Take by mouth.   Potassium Chloride ER 20 MEQ TBCR Take 20 mEq daily by mouth.     Objective: BP 135/79   Pulse (!) 106   Ht 5' 2 (1.575 m)   Wt 155 lb (70.3 kg)   BMI 28.35 kg/m   Physical Exam:  General: Alert and oriented. and No acute distress. Gait: Left sided antalgic gait.  Evaluation left foot demonstrates no redness.  No swelling.  Tenderness to palpation in the midfoot.  There is a small amount of swelling in the midfoot area.  Tender to palpation.  Toes are warm and well-perfused.  Sensation intact to the dorsum of the foot.  IMAGING: I personally ordered and reviewed the following images  X-rays left foot were obtained in clinic today.  These are nonweightbearing views.  No acute injuries are noted.  Foot does have the appearance of a high arch.  In the midfoot area, there is some small cysts, with the dorsal based osteophytes.  No bony lesions.  Impression: Left midfoot arthritis   New Medications:  No orders of the defined  types were placed in this encounter.     Oneil DELENA Horde, MD  11/10/2023 2:33 PM

## 2024-02-12 ENCOUNTER — Ambulatory Visit (HOSPITAL_COMMUNITY): Admission: RE | Admit: 2024-02-12 | Source: Ambulatory Visit

## 2024-02-12 DIAGNOSIS — F17210 Nicotine dependence, cigarettes, uncomplicated: Secondary | ICD-10-CM
